# Patient Record
Sex: Male | Born: 1980
Health system: Southern US, Community
[De-identification: ages and names within clinical notes are randomized; demographics above are authoritative.]

## PROBLEM LIST (undated history)

## (undated) ENCOUNTER — Emergency Department (HOSPITAL_COMMUNITY): Admission: EM | Payer: BLUE CROSS/BLUE SHIELD | Source: Home / Self Care

## (undated) DIAGNOSIS — I10 Essential (primary) hypertension: Secondary | ICD-10-CM

## (undated) DIAGNOSIS — E119 Type 2 diabetes mellitus without complications: Secondary | ICD-10-CM

## (undated) DIAGNOSIS — I82409 Acute embolism and thrombosis of unspecified deep veins of unspecified lower extremity: Secondary | ICD-10-CM

## (undated) DIAGNOSIS — K760 Fatty (change of) liver, not elsewhere classified: Secondary | ICD-10-CM

## (undated) DIAGNOSIS — K429 Umbilical hernia without obstruction or gangrene: Secondary | ICD-10-CM

## (undated) DIAGNOSIS — M109 Gout, unspecified: Secondary | ICD-10-CM

## (undated) HISTORY — DX: Essential (primary) hypertension: I10

## (undated) HISTORY — DX: Umbilical hernia without obstruction or gangrene: K42.9

## (undated) HISTORY — DX: Fatty (change of) liver, not elsewhere classified: K76.0

## (undated) HISTORY — DX: Type 2 diabetes mellitus without complications: E11.9

---

## 2006-06-01 ENCOUNTER — Encounter: Admission: RE | Admit: 2006-06-01 | Discharge: 2006-06-01 | Payer: Self-pay | Admitting: Orthopedic Surgery

## 2018-06-03 DIAGNOSIS — Z6839 Body mass index (BMI) 39.0-39.9, adult: Secondary | ICD-10-CM | POA: Diagnosis not present

## 2018-06-03 DIAGNOSIS — M109 Gout, unspecified: Secondary | ICD-10-CM | POA: Diagnosis not present

## 2018-06-22 DIAGNOSIS — Z86718 Personal history of other venous thrombosis and embolism: Secondary | ICD-10-CM | POA: Diagnosis not present

## 2018-06-22 DIAGNOSIS — M79604 Pain in right leg: Secondary | ICD-10-CM | POA: Diagnosis not present

## 2018-06-23 ENCOUNTER — Encounter (HOSPITAL_COMMUNITY): Payer: Self-pay | Admitting: Emergency Medicine

## 2018-06-23 ENCOUNTER — Ambulatory Visit (HOSPITAL_BASED_OUTPATIENT_CLINIC_OR_DEPARTMENT_OTHER)
Admission: RE | Admit: 2018-06-23 | Discharge: 2018-06-23 | Disposition: A | Discharge status: Home / Self Care | Payer: BLUE CROSS/BLUE SHIELD | Source: Ambulatory Visit | Attending: Emergency Medicine | Admitting: Emergency Medicine

## 2018-06-23 ENCOUNTER — Other Ambulatory Visit: Payer: Self-pay

## 2018-06-23 ENCOUNTER — Emergency Department (HOSPITAL_COMMUNITY)
Admission: EM | Admit: 2018-06-23 | Discharge: 2018-06-23 | Disposition: A | Discharge status: Home / Self Care | Payer: BLUE CROSS/BLUE SHIELD | Attending: Emergency Medicine | Admitting: Emergency Medicine

## 2018-06-23 ENCOUNTER — Emergency Department (HOSPITAL_COMMUNITY): Payer: BLUE CROSS/BLUE SHIELD

## 2018-06-23 DIAGNOSIS — M79651 Pain in right thigh: Secondary | ICD-10-CM | POA: Diagnosis not present

## 2018-06-23 DIAGNOSIS — Z86718 Personal history of other venous thrombosis and embolism: Secondary | ICD-10-CM | POA: Insufficient documentation

## 2018-06-23 DIAGNOSIS — M79609 Pain in unspecified limb: Secondary | ICD-10-CM

## 2018-06-23 DIAGNOSIS — M79661 Pain in right lower leg: Secondary | ICD-10-CM | POA: Insufficient documentation

## 2018-06-23 DIAGNOSIS — R7989 Other specified abnormal findings of blood chemistry: Secondary | ICD-10-CM | POA: Diagnosis not present

## 2018-06-23 DIAGNOSIS — R000 Tachycardia, unspecified: Secondary | ICD-10-CM | POA: Insufficient documentation

## 2018-06-23 DIAGNOSIS — M79604 Pain in right leg: Secondary | ICD-10-CM | POA: Diagnosis not present

## 2018-06-23 DIAGNOSIS — M791 Myalgia, unspecified site: Secondary | ICD-10-CM | POA: Diagnosis not present

## 2018-06-23 HISTORY — DX: Acute embolism and thrombosis of unspecified deep veins of unspecified lower extremity: I82.409

## 2018-06-23 HISTORY — DX: Gout, unspecified: M10.9

## 2018-06-23 LAB — CBC WITH DIFFERENTIAL/PLATELET
Abs Immature Granulocytes: 0.06 10*3/uL (ref 0.00–0.07)
Basophils Absolute: 0.1 10*3/uL (ref 0.0–0.1)
Basophils Relative: 1 %
Eosinophils Absolute: 0.4 10*3/uL (ref 0.0–0.5)
Eosinophils Relative: 3 %
HCT: 47.8 % (ref 39.0–52.0)
Hemoglobin: 15.9 g/dL (ref 13.0–17.0)
Immature Granulocytes: 1 %
Lymphocytes Relative: 16 %
Lymphs Abs: 2.1 10*3/uL (ref 0.7–4.0)
MCH: 30.4 pg (ref 26.0–34.0)
MCHC: 33.3 g/dL (ref 30.0–36.0)
MCV: 91.4 fL (ref 80.0–100.0)
Monocytes Absolute: 1.2 10*3/uL — ABNORMAL HIGH (ref 0.1–1.0)
Monocytes Relative: 9 %
Neutro Abs: 9.2 10*3/uL — ABNORMAL HIGH (ref 1.7–7.7)
Neutrophils Relative %: 70 %
Platelets: 407 10*3/uL — ABNORMAL HIGH (ref 150–400)
RBC: 5.23 MIL/uL (ref 4.22–5.81)
RDW: 12.9 % (ref 11.5–15.5)
WBC: 13 10*3/uL — ABNORMAL HIGH (ref 4.0–10.5)
nRBC: 0 % (ref 0.0–0.2)

## 2018-06-23 LAB — BASIC METABOLIC PANEL
Anion gap: 12 (ref 5–15)
BUN: 14 mg/dL (ref 6–20)
CO2: 24 mmol/L (ref 22–32)
Calcium: 9.6 mg/dL (ref 8.9–10.3)
Chloride: 101 mmol/L (ref 98–111)
Creatinine, Ser: 1.27 mg/dL — ABNORMAL HIGH (ref 0.61–1.24)
GFR calc Af Amer: >60 mL/min (ref >60)
GFR calc non Af Amer: >60 mL/min (ref >60)
Glucose, Bld: 168 mg/dL — ABNORMAL HIGH (ref 70–99)
Potassium: 3.9 mmol/L (ref 3.5–5.1)
Sodium: 137 mmol/L (ref 135–145)

## 2018-06-23 MED ORDER — HYDROCODONE-ACETAMINOPHEN 5-325 MG PO TABS: 1.0000 | ORAL_TABLET | Freq: Four times a day (QID) | ORAL | 0 refills | Status: DC | PRN

## 2018-06-23 MED ORDER — IOHEXOL 350 MG/ML SOLN
75.0000 mL | Freq: Once | INTRAVENOUS | Status: AC | PRN
  Administered 2018-06-23: 75 mL via INTRAVENOUS

## 2018-06-23 NOTE — Progress Notes (Signed)
VASCULAR LAB PRELIMINARY  PRELIMINARY  PRELIMINARY  PRELIMINARY  Right lower extremity venous duplex completed.    Preliminary report:  See CV Proc for results.   Jahzeel Poythress, RVT 06/23/2018, 12:11 PM

## 2018-06-23 NOTE — ED Notes (Signed)
Gave pt cheese/crackers and water to drink

## 2018-06-23 NOTE — Discharge Instructions (Signed)
Please see the information and instructions below regarding your visit.  Your diagnoses today include:  1. Right leg pain   2. Sinus tachycardia   3. Elevated serum creatinine     Tests performed today include: See side panel of your discharge paperwork for testing performed today. Vital signs are listed at the bottom of these instructions.   The CT of your chest showed no evidence of blood clot in the lungs.  Your lab work is showing a slight elevation in your creatinine level.  This is 1 of the kidney numbers.  Please have this rechecked by your primary care provider.  Medications prescribed:    Take any prescribed medications only as prescribed, and any over the counter medications only as directed on the packaging.  You have been prescribed Norco for pain. This is an opioid pain medication. You may take this medication every 4-6 hours as needed for pain. Only take this medication if you need it for breakthrough pain.   Do not combine this medication with Tylenol, as it may increase the risk of liver problems.  Do not combine this medication with alcohol.  Please be advised to avoid driving or operating heavy machinery while taking this medication, as it may make you drowsy or impair judgment.    Home care instructions:  Please follow any educational materials contained in this packet.   Follow-up instructions: IMPORTANT PATIENT INSTRUCTIONS:  You have been scheduled for an Outpatient Vascular Study at [facility].    If tomorrow is a Saturday, Sunday or holiday, please go to the [facility] Emergency Department Registration Desk at 11 am tomorrow morning and tell them you are there for a vascular study.   If tomorrow is a weekday (Monday-Friday), please go to [facility] Registration at 11 am and tell them you are there for a vascular study.  Return instructions:  Please return to the Emergency Department if you  experience worsening symptoms.  Please return the emergency department if you develop any discoloration of the right lower extremity, redness, increase in swelling, pain, or fevers greater than 100.4. Please return if you have any other emergent concerns.  Additional Information:   Your vital signs today were: BP 133/79    Pulse (!) 112    Resp (!) 23    SpO2 97%  If your blood pressure (BP) was elevated on multiple readings during this visit above 130 for the top number or above 80 for the bottom number, please have this repeated by your primary care provider within one month. --------------  Thank you for allowing [place] to participate in your care today.

## 2018-06-23 NOTE — ED Provider Notes (Signed)
[facility] EMERGENCY DEPARTMENT Provider Note   CSN: [id number] Arrival date & time: 06/23/18  0154    History   Chief Complaint Chief Complaint  Patient presents with   Leg Pain    HPI Patrick Chandler is a 38 y.o. male.     HPI   Patient is a 38 year old male with past medical history of DVT and gout presenting for pain in the right popliteal region.  Patient describes the pain as throbbing.  He reports the pain is now radiating down to the right lower calf as well as the right thigh.  He reports it is been present over the past 3 days.  Patient reports that when initially began it felt like the DVT he had in his left lower extremity.  Patient denies any loss of sensation distally.  He denies any significant swelling of the right lower extremity.  Patient denies any erythema of the extremity, fever, chills.  He denies any shortness of breath or chest pain.  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  He was driving many hours a day when he last had a DVT in 2014.  He was anticoagulated with Xarelto for 6 months.  He visited [facility] rocking him earlier today who are unable to perform vascular ultrasound due to staffing, however they did start him on Xarelto of which she has had 1 dose.  Past Medical History:  Diagnosis Date   DVT (deep venous thrombosis) (HCC)    Gout     There are no active problems to display for this patient.   History reviewed. No pertinent surgical history.      Home Medications    Prior to Admission medications   Not on File    Family History No family history on file.  Social History Social History   Tobacco Use   Smoking status: Never Smoker   Smokeless tobacco: Never Used  Substance Use Topics   Alcohol use: Not Currently   Drug use: Never     Allergies   Patient has no known allergies.   Review of Systems Review of Systems  Constitutional: Negative for chills and fever.    Respiratory: Negative for shortness of breath.   Cardiovascular: Negative for chest pain and leg swelling.  Gastrointestinal: Negative for nausea and vomiting.  Musculoskeletal: Positive for myalgias. Negative for joint swelling.  Neurological: Negative for weakness and numbness.  All other systems reviewed and are negative.    Physical Exam Updated Vital Signs BP 131/77    Pulse (!) 109    Resp (!) 22    SpO2 95%   Physical Exam Vitals signs and nursing note reviewed.  Constitutional:      General: He is not in acute distress.    Appearance: He is well-developed.  HENT:     Head: Normocephalic and atraumatic.  Eyes:     Conjunctiva/sclera: Conjunctivae normal.     Pupils: Pupils are equal, round, and reactive to light.  Neck:     Musculoskeletal: Normal range of motion and neck supple.  Cardiovascular:     Rate and Rhythm: Normal rate and regular rhythm.     Heart sounds: S1 normal and S2 normal. No murmur.  Pulmonary:     Effort: Pulmonary effort is normal.     Breath sounds: Normal breath sounds. No wheezing or rales.     Comments: Converses comfortably. Speaking comfortably in full sentences.  Abdominal:     General: There  is no distension.     Palpations: Abdomen is soft.  Musculoskeletal: Normal range of motion.        General: No deformity.     Right lower leg: No edema.     Left lower leg: No edema.     Comments: Bilateral lower extremities are symmetric without evidence of edema.  He has intact, 2+ DP pulses bilaterally. Right lower extremity exam: No erythema or fluctuance. No pain out of proportion. No break in skin or injury. Patient has tenderness to palpation of the right calf, popliteal region, and mild TTP overlying posterior thigh. Patient able to flex and extend knee joint without difficulty.   Lymphadenopathy:     Cervical: No cervical adenopathy.  Skin:    General: Skin is warm and dry.     Findings: No erythema or rash.  Neurological:     Mental  Status: He is alert.     Comments: Cranial nerves grossly intact. Patient moves extremities symmetrically and with good coordination.  Psychiatric:        Behavior: Behavior normal.        Thought Content: Thought content normal.        Judgment: Judgment normal.      ED Treatments / Results  Labs (all labs ordered are listed, but only abnormal results are displayed) Labs Reviewed  CBC WITH DIFFERENTIAL/PLATELET - Abnormal; Notable for the following components:      Result Value   WBC 13.0 (*)    Platelets 407 (*)    Neutro Abs 9.2 (*)    Monocytes Absolute 1.2 (*)    All other components within normal limits  BASIC METABOLIC PANEL - Abnormal; Notable for the following components:   Glucose, Bld 168 (*)    Creatinine, Ser 1.27 (*)    All other components within normal limits    EKG EKG Interpretation  Date/Time:  Sunday June 23 2018 02:31:01 EDT Ventricular Rate:  121 PR Interval:    QRS Duration: 109 QT Interval:  336 QTC Calculation: 477 R Axis:   -121 Text Interpretation:  Sinus tachycardia Consider right ventricular hypertrophy Borderline prolonged QT interval No old tracing to compare Confirmed by Marily Memos [phone]) on 06/23/2018 4:20:37 AM   Radiology Ct Angio Chest Pe W/cm &/or Wo Cm  Result Date: 06/23/2018 CLINICAL DATA:  Right leg pain and history of DVT. EXAM: CT ANGIOGRAPHY CHEST WITH CONTRAST TECHNIQUE: Multidetector CT imaging of the chest was performed using the standard protocol during bolus administration of intravenous contrast. Multiplanar CT image reconstructions and MIPs were obtained to evaluate the vascular anatomy. CONTRAST:  75mL OMNIPAQUE IOHEXOL 350 MG/ML SOLN COMPARISON:  None. FINDINGS: Cardiovascular: --Pulmonary arteries: Contrast injection is sufficient to demonstrate satisfactory opacification of the pulmonary arteries to the segmental level, with attenuation of 206 HU at the main pulmonary artery. There is no pulmonary embolus. The main  pulmonary artery is within normal limits for size. --Aorta: Satisfactory opacification of the thoracic aorta. No aortic dissection or other acute aortic syndrome. Conventional 3 vessel aortic branching pattern. The aortic course and caliber are normal. There is no aortic atherosclerosis. --Heart: Normal size. No pericardial effusion. Mediastinum/Nodes: No mediastinal, hilar or axillary lymphadenopathy. The visualized thyroid and thoracic esophageal course are unremarkable. Lungs/Pleura: No pulmonary nodules or masses. No pleural effusion or pneumothorax. No focal airspace consolidation. No focal pleural abnormality. Upper Abdomen: Contrast bolus timing is not optimized for evaluation of the abdominal organs. Within this limitation, the visualized organs of the upper abdomen  are normal. Musculoskeletal: No chest wall abnormality. No acute or significant osseous findings. Review of the MIP images confirms the above findings. IMPRESSION: No pulmonary embolus or other acute abnormality. Electronically Signed   By: Deatra Robinson M.D.   On: 06/23/2018 03:48    Procedures Procedures (including critical care time)  EMERGENCY DEPARTMENT US SOFT TISSUE INTERPRETATION "Study: Limited Soft Tissue Ultrasound"  INDICATIONS: Pain Multiple views of the body part were obtained in real-time with a multi-frequency linear probe PERFORMED BY:  Myself IMAGES ARCHIVED?: No SIDE:Right  BODY PART:Lower extremity FINDINGS: No abcess noted and Cellulitis absent INTERPRETATION:  No abcess noted and No cellulitis noted   CPT: Neck 76536-26  Upper extremity 76880-26  Axilla 38329-19  Chest wall 16606-00  Beast 45997-74  Upper back 14239-53  Lower back 20233-43  Abdominal wall 56861-68  Pelvic wall 37290-21  Lower extremity 11552-08  Other soft tissue 02233-61   Medications Ordered in ED Medications  iohexol (OMNIPAQUE) 350 MG/ML injection 75 mL (75 mLs Intravenous Contrast Given 06/23/18 0332)      Initial Impression / Assessment and Plan / ED Course  I have reviewed the triage vital signs and the nursing notes.  Pertinent labs & imaging results that were available during my care of the patient were reviewed by me and considered in my medical decision making (see chart for details).  Clinical Course as of Jun 23 723  Sun Jun 23, 2018  0327 Obtaining CTA given clinical symptoms of DVT and tachycardia.   Pulse Rate(!): 114 [AM]  0724 Respirations derived from validation of readings picked up by cardiac monitor. RR 22 on my final evaluation of patient. Patient with no increased work of breathing.   Resp(!): 28 [AM]    Clinical Course User Index [AM] Elisha Ponder, PA-C       Patient is nontoxic-appearing, afebrile, and in no acute distress at rest.  He is slightly tachycardic and tachypneic.  Denies any chest pain or shortness of breath, however given the clinical diagnosis concerning for DVT, will assess for pulmonary embolism with CTPA.  Right lower extremity without evidence of infection clinically.  Right lower extremity ultrasound performed at bedside assessing for cellulitis or abscess which demonstrates no evidence of either pathology.  Patient is able to flex and extend the right knee without difficulty and there is no erythema or circumferential swelling suggestive of septic arthritis.  No pain out of proportion to palpation of the lower extremity.   CTPA demonstrates no evidence of pulmonary embolus.  Patient did have a slight leukocytosis of 13.  This appears nonspecific and he has no other infectious symptoms associated with this leukocytosis.  Patient did continue to have some tachycardic readings here in the emergency department.  Manually checked pulse and it is 108. He does not have any infectious etiology identified on his work-up.  This does not appear to be indicative of a sepsis syndrome without evidence of infection, and he is negative for PE.  Work-up, vital  signs, and clinical presentation reviewed with attending physician, Dr. Marily Memos, who is in agreement with plan of care. Patient is already taking Xarelto starting today so he is being anticoagulated.   I have reviewed the patient's information in the [place] Controlled Substance Database for the past 12 months and found them to have no Rx overlapping.  Opiates were prescribed for an acute, painful condition. The patient was given information on side effects and encouraged to use other, non-opiate pain medication primary, only  using opiate medicine sparingly for severe pain.  Final Clinical Impressions(s) / ED Diagnoses   Final diagnoses:  Right leg pain    ED Discharge Orders         Ordered    LE VENOUS     06/23/18 0458    HYDROcodone-acetaminophen (NORCO/VICODIN) 5-325 MG tablet  Every 6 hours PRN     06/23/18 0502           Elisha PonderMurray, Rodney Yera B, PA-C 06/23/18 0727    Mesner, Barbara CowerJason, MD 06/24/18 [phone]

## 2018-06-23 NOTE — ED Triage Notes (Signed)
Pt c/o pain behind his right knee that radiates to the thigh and calf. Pt seen at [facility] yesterday for same, started on xarelto. Per pt, no imaging or lab work done. Pt has hx of DVT. Denies chest pain/shortness of breath. Reports pain increased tonight.

## 2018-06-25 DIAGNOSIS — Z6838 Body mass index (BMI) 38.0-38.9, adult: Secondary | ICD-10-CM | POA: Diagnosis not present

## 2018-06-25 DIAGNOSIS — Z86718 Personal history of other venous thrombosis and embolism: Secondary | ICD-10-CM | POA: Diagnosis not present

## 2018-06-25 DIAGNOSIS — M25461 Effusion, right knee: Secondary | ICD-10-CM | POA: Diagnosis not present

## 2018-06-25 DIAGNOSIS — M109 Gout, unspecified: Secondary | ICD-10-CM | POA: Diagnosis not present

## 2018-06-27 DIAGNOSIS — Q383 Other congenital malformations of tongue: Secondary | ICD-10-CM | POA: Diagnosis not present

## 2018-10-20 DIAGNOSIS — L03114 Cellulitis of left upper limb: Secondary | ICD-10-CM | POA: Diagnosis not present

## 2019-04-09 DIAGNOSIS — R1013 Epigastric pain: Secondary | ICD-10-CM | POA: Diagnosis not present

## 2019-04-09 DIAGNOSIS — M10072 Idiopathic gout, left ankle and foot: Secondary | ICD-10-CM | POA: Diagnosis not present

## 2019-04-09 DIAGNOSIS — K219 Gastro-esophageal reflux disease without esophagitis: Secondary | ICD-10-CM | POA: Diagnosis not present

## 2019-04-09 DIAGNOSIS — K141 Geographic tongue: Secondary | ICD-10-CM | POA: Diagnosis not present

## 2019-04-10 DIAGNOSIS — R1013 Epigastric pain: Secondary | ICD-10-CM | POA: Diagnosis not present

## 2019-04-14 DIAGNOSIS — K760 Fatty (change of) liver, not elsewhere classified: Secondary | ICD-10-CM | POA: Diagnosis not present

## 2019-04-14 DIAGNOSIS — R599 Enlarged lymph nodes, unspecified: Secondary | ICD-10-CM | POA: Diagnosis not present

## 2019-04-14 DIAGNOSIS — K859 Acute pancreatitis without necrosis or infection, unspecified: Secondary | ICD-10-CM | POA: Diagnosis not present

## 2019-04-14 DIAGNOSIS — N200 Calculus of kidney: Secondary | ICD-10-CM | POA: Diagnosis not present

## 2019-04-23 DIAGNOSIS — R5383 Other fatigue: Secondary | ICD-10-CM | POA: Diagnosis not present

## 2019-04-23 DIAGNOSIS — R748 Abnormal levels of other serum enzymes: Secondary | ICD-10-CM | POA: Diagnosis not present

## 2019-04-23 DIAGNOSIS — K219 Gastro-esophageal reflux disease without esophagitis: Secondary | ICD-10-CM | POA: Diagnosis not present

## 2019-05-05 DIAGNOSIS — K143 Hypertrophy of tongue papillae: Secondary | ICD-10-CM | POA: Diagnosis not present

## 2019-05-05 DIAGNOSIS — R748 Abnormal levels of other serum enzymes: Secondary | ICD-10-CM | POA: Diagnosis not present

## 2019-05-05 DIAGNOSIS — R1115 Cyclical vomiting syndrome unrelated to migraine: Secondary | ICD-10-CM | POA: Diagnosis not present

## 2019-05-05 DIAGNOSIS — R591 Generalized enlarged lymph nodes: Secondary | ICD-10-CM | POA: Diagnosis not present

## 2019-06-23 DIAGNOSIS — Z6841 Body Mass Index (BMI) 40.0 and over, adult: Secondary | ICD-10-CM | POA: Diagnosis not present

## 2019-06-23 DIAGNOSIS — K219 Gastro-esophageal reflux disease without esophagitis: Secondary | ICD-10-CM | POA: Diagnosis not present

## 2019-06-23 DIAGNOSIS — I10 Essential (primary) hypertension: Secondary | ICD-10-CM | POA: Diagnosis not present

## 2019-07-01 DIAGNOSIS — R5383 Other fatigue: Secondary | ICD-10-CM | POA: Diagnosis not present

## 2019-07-01 DIAGNOSIS — K219 Gastro-esophageal reflux disease without esophagitis: Secondary | ICD-10-CM | POA: Diagnosis not present

## 2019-07-01 DIAGNOSIS — I10 Essential (primary) hypertension: Secondary | ICD-10-CM | POA: Diagnosis not present

## 2019-07-07 DIAGNOSIS — R1115 Cyclical vomiting syndrome unrelated to migraine: Secondary | ICD-10-CM | POA: Diagnosis not present

## 2019-07-07 DIAGNOSIS — R748 Abnormal levels of other serum enzymes: Secondary | ICD-10-CM | POA: Diagnosis not present

## 2019-07-07 DIAGNOSIS — K143 Hypertrophy of tongue papillae: Secondary | ICD-10-CM | POA: Diagnosis not present

## 2019-07-07 DIAGNOSIS — R221 Localized swelling, mass and lump, neck: Secondary | ICD-10-CM | POA: Diagnosis not present

## 2019-07-07 DIAGNOSIS — R591 Generalized enlarged lymph nodes: Secondary | ICD-10-CM | POA: Diagnosis not present

## 2019-07-10 DIAGNOSIS — R748 Abnormal levels of other serum enzymes: Secondary | ICD-10-CM | POA: Diagnosis not present

## 2019-07-10 DIAGNOSIS — Z1389 Encounter for screening for other disorder: Secondary | ICD-10-CM | POA: Diagnosis not present

## 2019-07-10 DIAGNOSIS — I10 Essential (primary) hypertension: Secondary | ICD-10-CM | POA: Diagnosis not present

## 2019-07-10 DIAGNOSIS — K143 Hypertrophy of tongue papillae: Secondary | ICD-10-CM | POA: Diagnosis not present

## 2019-07-10 DIAGNOSIS — M109 Gout, unspecified: Secondary | ICD-10-CM | POA: Diagnosis not present

## 2019-07-10 DIAGNOSIS — Z1331 Encounter for screening for depression: Secondary | ICD-10-CM | POA: Diagnosis not present

## 2019-07-16 DIAGNOSIS — R748 Abnormal levels of other serum enzymes: Secondary | ICD-10-CM | POA: Diagnosis not present

## 2019-07-16 DIAGNOSIS — R1115 Cyclical vomiting syndrome unrelated to migraine: Secondary | ICD-10-CM | POA: Diagnosis not present

## 2019-07-16 DIAGNOSIS — Z6841 Body Mass Index (BMI) 40.0 and over, adult: Secondary | ICD-10-CM | POA: Diagnosis not present

## 2019-07-16 DIAGNOSIS — K760 Fatty (change of) liver, not elsewhere classified: Secondary | ICD-10-CM | POA: Diagnosis not present

## 2019-08-28 ENCOUNTER — Ambulatory Visit (INDEPENDENT_AMBULATORY_CARE_PROVIDER_SITE_OTHER): Payer: BLUE CROSS/BLUE SHIELD | Admitting: Gastroenterology

## 2021-02-11 IMAGING — CT CT ANGIOGRAPHY CHEST
2 of 7 series · 18 of 46 positions shown · IV contrast (APPLIED)
Comparison: None.

CLINICAL DATA: Right leg pain and history of DVT.

EXAM:
CT ANGIOGRAPHY CHEST WITH CONTRAST
TECHNIQUE: Multidetector CT imaging of the chest was performed using the
standard protocol during bolus administration of intravenous
contrast. Multiplanar CT image reconstructions and MIPs were
obtained to evaluate the vascular anatomy.
CONTRAST:  75mL OMNIPAQUE IOHEXOL 350 MG/ML SOLN

[Series 6: thins · axial · 0.90mm/px · z∈[+1137,+1396]mm · 15 of 418 slices shown]
[im 24/418  lung]
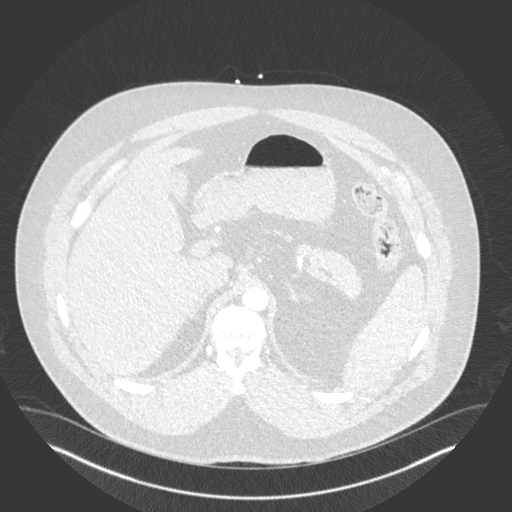
[im 47/418  soft-tissue]
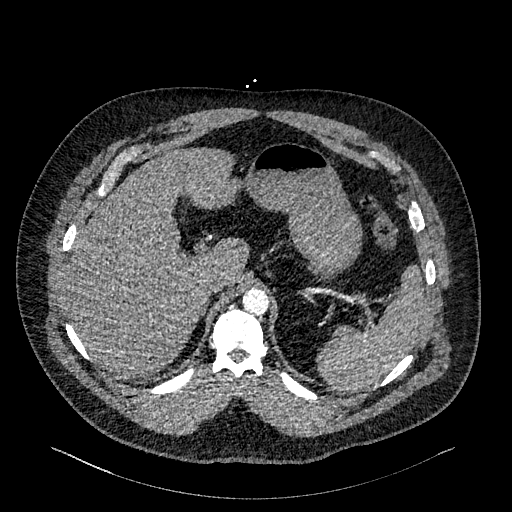
[im 70/418  lung]
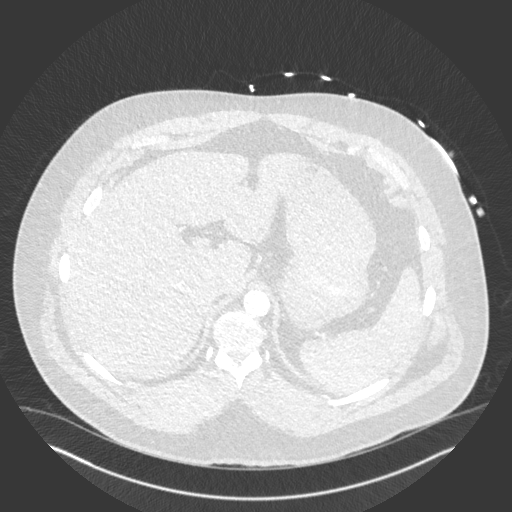
[im 93/418  soft-tissue]
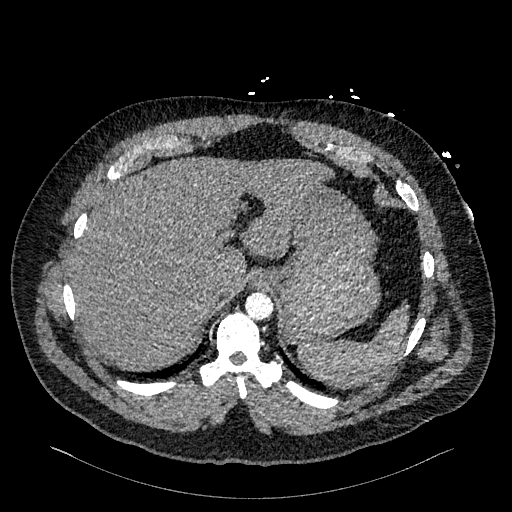
[im 140/418  lung]
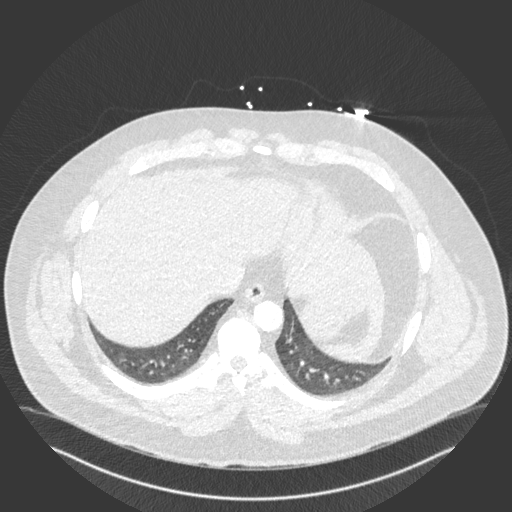
[im 163/418  soft-tissue]
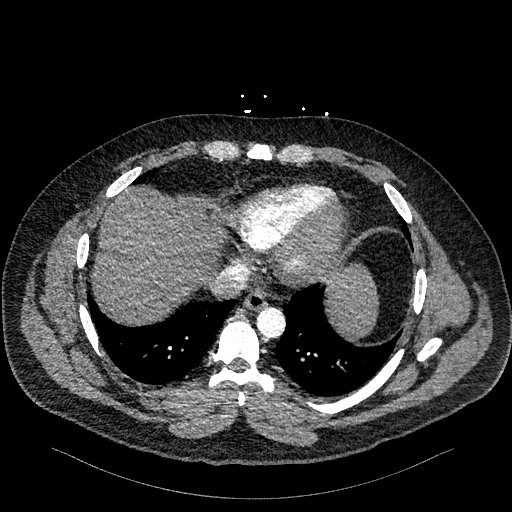
[im 186/418  lung]
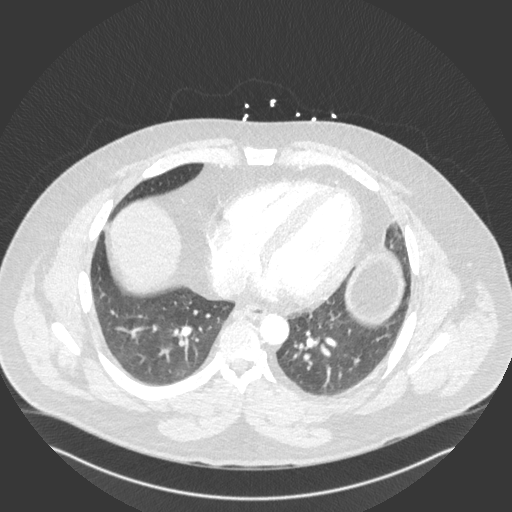
[im 209/418  soft-tissue]
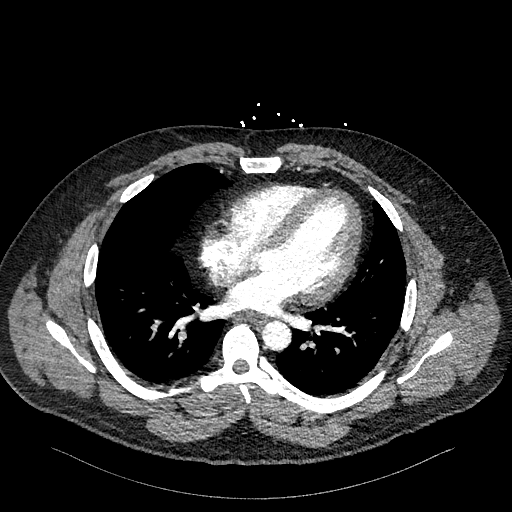
[im 232/418  lung]
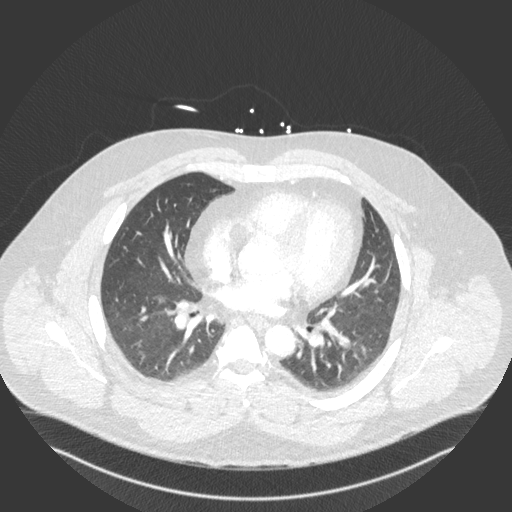
[im 255/418  soft-tissue]
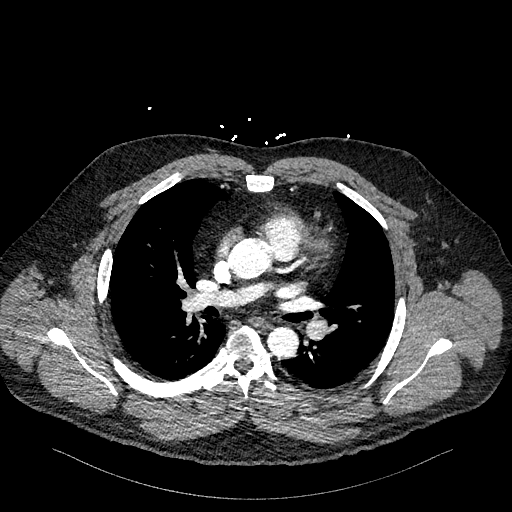
[im 279/418  lung]
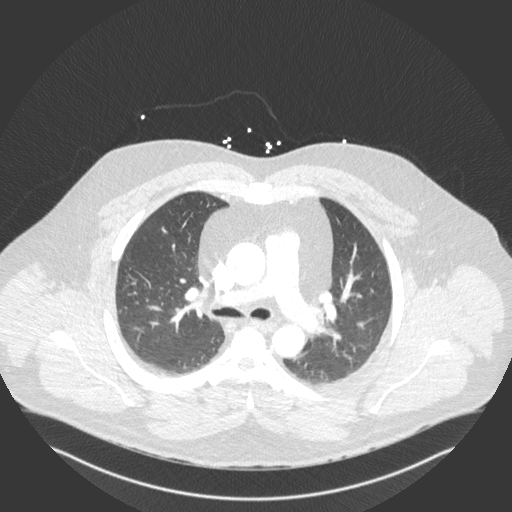
[im 325/418  soft-tissue]
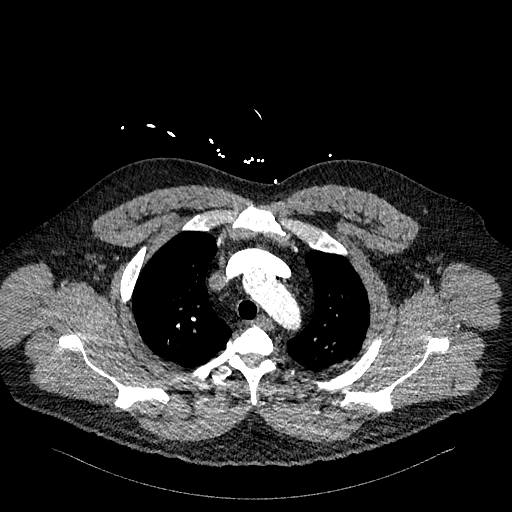
[im 348/418  lung]
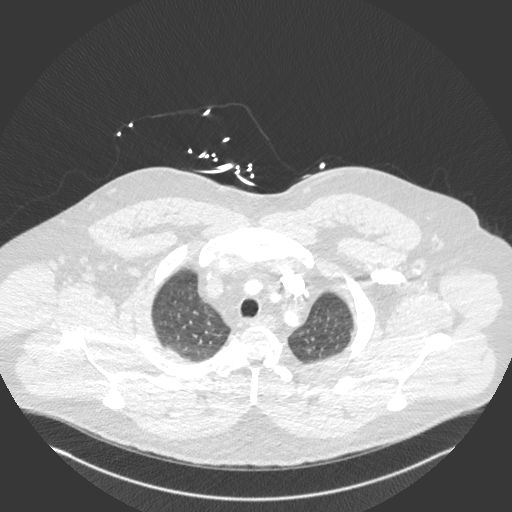
[im 371/418  soft-tissue]
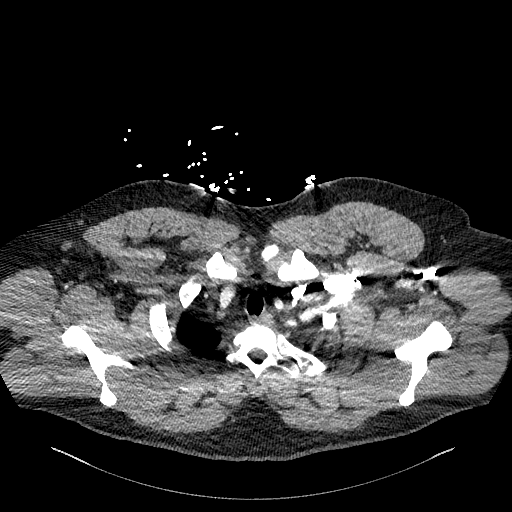
[im 394/418  lung]
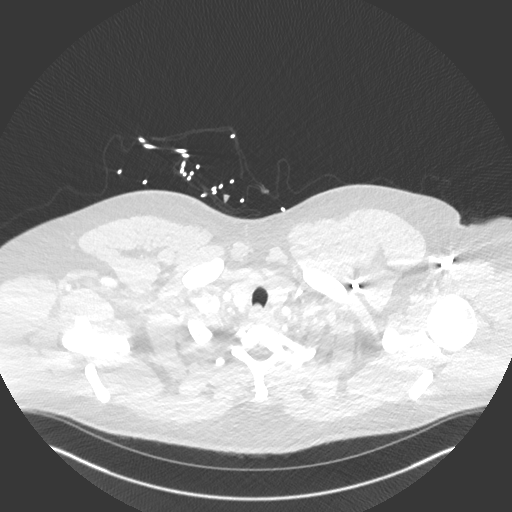

[Series 8: cor · coronal · 0.59mm/px · 3 of 151 slices shown]
[im 38/151  soft-tissue]
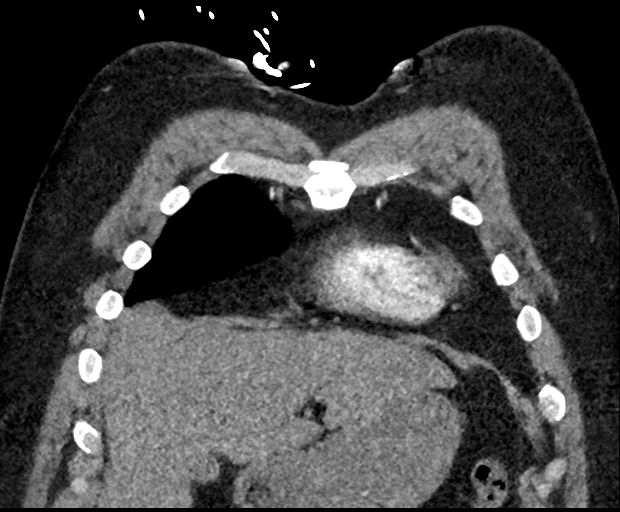
[im 76/151  soft-tissue]
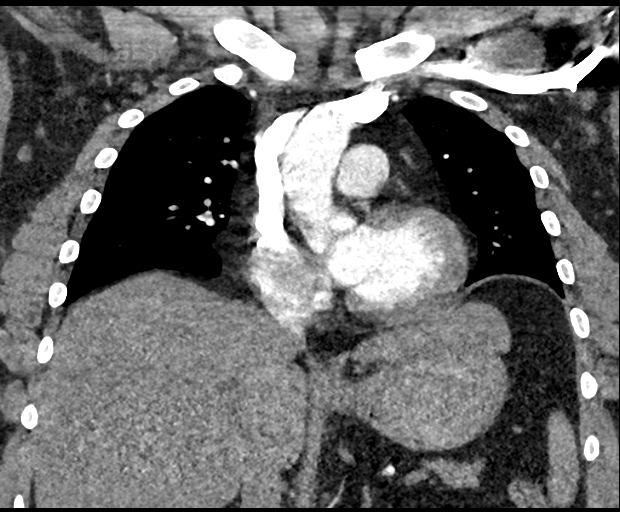
[im 113/151  soft-tissue]
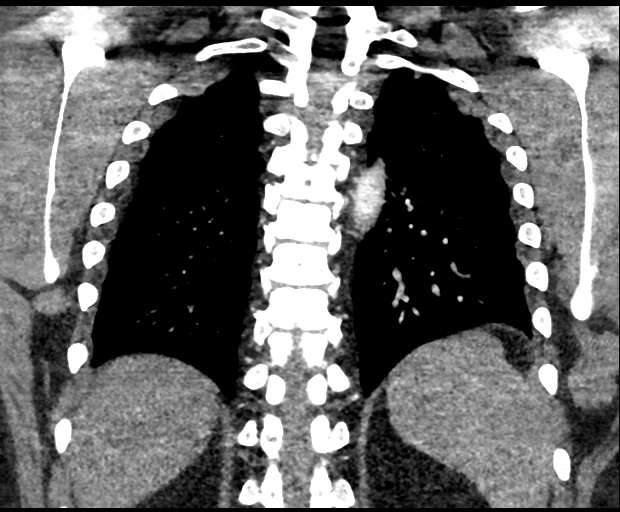

[18 of 46 positions shown; findings below may reference images not displayed]

FINDINGS: Cardiovascular:

--Pulmonary arteries: Contrast injection is sufficient to
demonstrate satisfactory opacification of the pulmonary arteries to
the segmental level, with attenuation of 206 HU at the main
pulmonary artery. There is no pulmonary embolus. The main pulmonary
artery is within normal limits for size.

--Aorta: Satisfactory opacification of the thoracic aorta. No aortic
dissection or other acute aortic syndrome. Conventional 3 vessel
aortic branching pattern. The aortic course and caliber are normal.
There is no aortic atherosclerosis.

--Heart: Normal size. No pericardial effusion.

Mediastinum/Nodes: No mediastinal, hilar or axillary
lymphadenopathy. The visualized thyroid and thoracic esophageal
course are unremarkable.

Lungs/Pleura: No pulmonary nodules or masses. No pleural effusion or
pneumothorax. No focal airspace consolidation. No focal pleural
abnormality.

Upper Abdomen: Contrast bolus timing is not optimized for evaluation
of the abdominal organs. Within this limitation, the visualized
organs of the upper abdomen are normal.

Musculoskeletal: No chest wall abnormality. No acute or significant
osseous findings.

Review of the MIP images confirms the above findings.
IMPRESSION: No pulmonary embolus or other acute abnormality.

## 2021-07-21 ENCOUNTER — Encounter: Payer: Self-pay | Admitting: Internal Medicine

## 2021-08-09 NOTE — Progress Notes (Deleted)
GI Office Note    Referring Provider: Curlene Labrum, MD Primary Care Physician:  Curlene Labrum, MD  Primary Gastroenterologist: Dr. Abbey Chatters  Chief Complaint   No chief complaint on file.    History of Present Illness   Patrick Chandler is a 41 y.o. male presenting today at the request of Burdine, Virgina Evener, MD for ***elevated amylase and lipase.  Most recent labs via [facility] from June 2021 with elevated lipase (4636) and amylase (914), AST 195, ALT 164, alk phos 134, normal CBC.  Has previously seen [facility] hematology oncology for isolated elevated amylase in March 2021.  Stated that this is usually due to salivary disease, macromylasemia, idiopathic hypermylasemia.  Was also having recurrent emesis, was referred for upper endoscopy.  Had hepatic steatosis noted on abdominal CT performed February 2021, likely felt as it was due to morbid obesity.  He had been experiencing severe epigastric pain for 3 to 4 months associated with emesis, not feeling better after he vomits.  Also noted tenderness to left submandibular salivary gland.  Denied tobacco or alcohol abuse.  Per care everywhere note he has CT abdomen in February 2021 showing 3 mm peripheral left lower lobe lung nodule, liver decrease in attenuation diffusely measuring 19.6 cm consistent with steatosis, numerous periportal, abdominal peritoneal ligament retroperitoneal and mesenteric lymph nodes most of which are subcentimeter in size  Follow-up with heme-onc 07/16/2019.  Patient had not had EGD, had a few episodes of emesis since last visit.  Reported never had an abdominal ultrasound.  Had gained 1 pound since last visit.  Today: Pancreatitis Patient complains of {complaints; pancreatitis:11701}. Symptoms have been present {0-10:33138} {unit:11}. Symptoms include {pancreatitis sx:11697}. Symptoms have been {course:17}.  The pain is located in {pain list:11699}, is described as {quality:10965}, {pain assessment:11180}, and  is rated {gi pain:11702}. The pain radiates to {pain list:11699}. The patient states that the pain is relieved by {pain relief:11563}. Aggravating factors include {aggravating factors:11700}. The patient has tried {pain relief :11349} with {relief:12621} relief.  Alcohol history:{alcohol:11675}. Prior diagnostic studies: {gi prior dx studies:11703}    Past Medical History:  Diagnosis Date   DVT (deep venous thrombosis) ([place])    Gout     No past surgical history on file.  Current Outpatient Medications  Medication Sig Dispense Refill   HYDROcodone-acetaminophen (NORCO/VICODIN) 5-325 MG tablet Take 1-2 tablets by mouth every 6 (six) hours as needed. 8 tablet 0   No current facility-administered medications for this visit.    Allergies as of 08/11/2021   (No Known Allergies)    No family history on file.  Social History   Socioeconomic History   Marital status: Married    Spouse name: Not on file   Number of children: Not on file   Years of education: Not on file   Highest education level: Not on file  Occupational History   Not on file  Tobacco Use   Smoking status: Never   Smokeless tobacco: Never  Substance and Sexual Activity   Alcohol use: Not Currently   Drug use: Never   Sexual activity: Not on file  Other Topics Concern   Not on file  Social History Narrative   Not on file   Social Determinants of Health   Financial Resource Strain: Not on file  Food Insecurity: Not on file  Transportation Needs: Not on file  Physical Activity: Not on file  Stress: Not on file  Social Connections: Not on file  Intimate Partner Violence: Not  on file     Review of Systems   Gen: Denies any fever, chills, fatigue, weight loss, lack of appetite.  CV: Denies chest pain, heart palpitations, peripheral edema, syncope.  Resp: Denies shortness of breath at rest or with exertion. Denies wheezing or cough.  GI: see HPI GU : Denies urinary burning, urinary frequency, urinary  hesitancy MS: Denies joint pain, muscle weakness, cramps, or limitation of movement.  Derm: Denies rash, itching, dry skin Psych: Denies depression, anxiety, memory loss, and confusion Heme: Denies bruising, bleeding, and enlarged lymph nodes.   Physical Exam   There were no vitals taken for this visit.  General:   Alert and oriented. Pleasant and cooperative. Well-nourished and well-developed.  Head:  Normocephalic and atraumatic. Eyes:  Without icterus, sclera clear and conjunctiva pink.  Ears:  Normal auditory acuity. Mouth:  No deformity or lesions, oral mucosa pink.  Lungs:  Clear to auscultation bilaterally. No wheezes, rales, or rhonchi. No distress.  Heart:  S1, S2 present without murmurs appreciated.  Abdomen:  +BS, soft, non-tender and non-distended. No HSM noted. No guarding or rebound. No masses appreciated.  Rectal:  Deferred  Msk:  Symmetrical without gross deformities. Normal posture. Extremities:  Without edema. Neurologic:  Alert and  oriented x4;  grossly normal neurologically. Skin:  Intact without significant lesions or rashes. Psych:  Alert and cooperative. Normal mood and affect.   Assessment   Patrick Chandler is a 41 y.o. male with a history of gout, GERD, DVT and pancreatitis*** presenting today for evaluation of elevated amylase.  Elevated Amylase and Lipase/History of pancreatitis:   PLAN   ***    Venetia Night, MSN, FNP-BC, AGACNP-BC [facility]

## 2021-08-10 ENCOUNTER — Ambulatory Visit: Payer: PRIVATE HEALTH INSURANCE | Admitting: Internal Medicine

## 2021-08-11 ENCOUNTER — Ambulatory Visit: Payer: PRIVATE HEALTH INSURANCE | Admitting: Gastroenterology

## 2021-09-11 NOTE — Progress Notes (Deleted)
GI Office Note    Referring Provider: Curlene Labrum, MD Primary Care Physician:  Curlene Labrum, MD  Primary Gastroenterologist: Dr. Abbey Chatters  Chief Complaint   No chief complaint on file.   History of Present Illness   Patrick Chandler is a 41 y.o. male presenting today at the request of Burdine, Virgina Evener, MD for ***elevated amylase and lipase.    Has previously seen [facility] hematology oncology for isolated elevated amylase in March 2021. Patient also reported chronic intermittent nausea/vomiting and intermittent epigastric pain that would last for about 4 hours. Pain owuld not improve with vomiting. Reported tenderness to left submandibular salivary glands. Dr. Federico Flake stated that this is usually due to salivary disease, macromylasemia, idiopathic hypermylasemia.  Was also having recurrent emesis with brushing his tongue and teeth, was referred for upper endoscopy.  Diagnosed with black hairy tongue. Had hepatic steatosis noted on abdominal CT performed February 2021, likely felt as it was due to morbid obesity. Denied tobacco or alcohol abuse. He had prior labs form March 2021 with IgG 1481, IgA 527, and IgM 48 (etiology felt to be hepatic steatosis).  Patient also reported chronic intermittent nausea/vomiting.    Per care everywhere note he had CT abdomen in February 2021 showing 3 mm peripheral left lower lobe lung nodule, liver decrease in attenuation diffusely measuring 19.6 cm consistent with steatosis, numerous periportal, abdominal peritoneal ligament retroperitoneal and mesenteric lymph nodes most of which are subcentimeter in size   Follow-up with heme-onc 07/16/2019.  Patient had not had EGD, had a few episodes of emesis since last visit.  Reported never had an abdominal ultrasound.  Had gained 1 pound since last visit.  Had hospitalization at [facility] in June 2021 with elevated lipase (4636) and amylase (914), AST 195, ALT 164, alk phos 134, normal CBC. CT A/P with mild acute  pancreatitis and suspected cholelithiasis, tiny non-obstructing right renal calculus and small umbilical hernia containing fat. He was discharged with instructions to remain hydrated and clear liquid diet for 24-48 hours and then advance as tolerated. And to avoid tylenol or alcohol. In September 2021 patient had not followed up with [facility] hepatology but did report a 25lb weight loss due to diet and exercise.   Most recent labs sent with referral paperwork 07/08/21: glucose 136, Cr 1.31, normal LFTs, bili and alk phos, elevated triglycerides 151, HgbA1c 6, Amylsae 718?Marland Kitchen   PCP visit 07/13/21. 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 Weight was 322lbs, height 6' 2.5". Was given GI referral to discuss fatty liver, elevated amylase, and EGD, has been on PPI daily.    Today: GERD:  Epigastric pain? N/V? Yellowing of skin?    Past Medical History:  Diagnosis Date   DVT (deep venous thrombosis) (HCC)    Gout     No past surgical history on file.  Current Outpatient Medications  Medication Sig Dispense Refill   HYDROcodone-acetaminophen (NORCO/VICODIN) 5-325 MG tablet Take 1-2 tablets by mouth every 6 (six) hours as needed. 8 tablet 0   No current facility-administered medications for this visit.    Allergies as of 09/12/2021   (No Known Allergies)    No family history on file.  Social History   Socioeconomic History   Marital status: Married    Spouse name: Not on file   Number of children: Not on file   Years of education: Not on file   Highest education level: Not on file  Occupational History  Not on file  Tobacco Use   Smoking status: Never   Smokeless tobacco: Never  Substance and Sexual Activity   Alcohol use: Not Currently   Drug use: Never   Sexual activity: Not on file  Other Topics Concern   Not on file  Social History Narrative   Not on file   Social Determinants of Health   Financial Resource Strain: Not on file   Food Insecurity: Not on file  Transportation Needs: Not on file  Physical Activity: Not on file  Stress: Not on file  Social Connections: Not on file  Intimate Partner Violence: Not on file     Review of Systems   Gen: Denies any fever, chills, fatigue, weight loss, lack of appetite.  CV: Denies chest pain, heart palpitations, peripheral edema, syncope.  Resp: Denies shortness of breath at rest or with exertion. Denies wheezing or cough.  GI: see HPI GU : Denies urinary burning, urinary frequency, urinary hesitancy MS: Denies joint pain, muscle weakness, cramps, or limitation of movement.  Derm: Denies rash, itching, dry skin Psych: Denies depression, anxiety, memory loss, and confusion Heme: Denies bruising, bleeding, and enlarged lymph nodes.   Physical Exam   There were no vitals taken for this visit.   General:   Alert and oriented. Pleasant and cooperative. Well-nourished and well-developed.  Head:  Normocephalic and atraumatic. Eyes:  Without icterus, sclera clear and conjunctiva pink.  Ears:  Normal auditory acuity. Mouth:  No deformity or lesions, oral mucosa pink.  Lungs:  Clear to auscultation bilaterally. No wheezes, rales, or rhonchi. No distress.  Heart:  S1, S2 present without murmurs appreciated.  Abdomen:  +BS, soft, non-tender and non-distended. No HSM noted. No guarding or rebound. No masses appreciated.  Rectal:  Deferred  Msk:  Symmetrical without gross deformities. Normal posture. Extremities:  Without edema. Neurologic:  Alert and  oriented x4;  grossly normal neurologically. Skin:  Intact without significant lesions or rashes. Psych:  Alert and cooperative. Normal mood and affect.   Assessment   Patrick Chandler is a 41 y.o. male with a history of DVT in 2014, chronic elevation of amylase, pancreatitis, HTN, HLD, GERD*** presenting today for evaluation of elevated amylase.   Elevated Amylase/History of pancreatitis: Followed previously by heme onc.  See prior workup as stated in HPI. Leukemia/lymphoma workup negative Has black hairy tongue. This has been chronically elevated. Had episode of pancreatitis in June 2021. Most recent labs May 2023 with amylase elevated to 718, has previously been as high as 914. When amylase was at peak pt also had elevated Lipase to 4636 in June 2021. Pancreatitis noted on abdominal CT June 2021.   Hepatic steatosis: Noted on imaging from February 2021. Had prior elevations in LFTs at that time as well with noted cholelithiasis. EBV, CMV, ANA,  Hepatitis panel, HIV, Ttg IgA all negative in March 2021. IgG 1481, IgA 527, and IgM 48 in March 2021, IgA elevation felt to be elevated due to fatty liver and obesity. Most recent labs completed May 2023 with normal LFTs, bilirubin, and alk phos. No recent dedicated abdominal imaging. Would like to update abdominal ultrasound.   GERD: Remote history of chronic intermittent epigastric pain as well as nausea/vomiting.    PLAN   *** Proceed with upper endoscopy with propofol by Dr. Abbey Chatters in near future: the risks, benefits, and alternatives have been discussed with the patient in detail. The patient states understanding and desires to proceed. ASA 3 Abdominal ultrasound.  Continue PPI daily.  Follow up with hematology/oncology   Venetia Night, MSN, FNP-BC, AGACNP-BC [facility]

## 2021-09-12 ENCOUNTER — Ambulatory Visit: Payer: PRIVATE HEALTH INSURANCE | Admitting: Gastroenterology

## 2021-09-12 ENCOUNTER — Encounter: Payer: Self-pay | Admitting: Internal Medicine

## 2023-12-06 ENCOUNTER — Encounter: Payer: Self-pay | Admitting: Gastroenterology

## 2023-12-10 ENCOUNTER — Encounter: Payer: Self-pay | Admitting: Gastroenterology

## 2023-12-10 ENCOUNTER — Ambulatory Visit (INDEPENDENT_AMBULATORY_CARE_PROVIDER_SITE_OTHER): Admitting: Gastroenterology

## 2023-12-10 VITALS — BP 124/84 | HR 87 | Temp 99.8°F | Ht 74.0 in | Wt 327.2 lb

## 2023-12-10 DIAGNOSIS — K5900 Constipation, unspecified: Secondary | ICD-10-CM

## 2023-12-10 DIAGNOSIS — K429 Umbilical hernia without obstruction or gangrene: Secondary | ICD-10-CM

## 2023-12-10 DIAGNOSIS — R748 Abnormal levels of other serum enzymes: Secondary | ICD-10-CM | POA: Diagnosis not present

## 2023-12-10 DIAGNOSIS — K625 Hemorrhage of anus and rectum: Secondary | ICD-10-CM

## 2023-12-10 NOTE — H&P (View-Only) (Signed)
 GI Office Note    Referring Provider: Lari Elspeth BRAVO, MD Primary Care Physician:  Lari Elspeth BRAVO, MD  Primary Gastroenterologist: Ozell Hollingshead, MD   Chief Complaint   Chief Complaint  Patient presents with   Rectal Bleeding     History of Present Illness   Patrick Chandler is a 43 y.o. male presenting today at the request of Dr. Lari for rectal bleeding. He also has history of chronic elevation of amylase which was initially found when patient presented with pancreatitis several years back, lipase significantly elevated at that time. He has known cholelithaisis. He was seen by oncology for chronic elevation of amlyase. Advised to have EGD. This was never completed.   Discussed the use of AI scribe software for clinical note transcription with the patient, who gave verbal consent to proceed.    He experiences bright red rectal bleeding with each bowel movement for the past week, with blood mixed in the stool and significant amounts in the toilet bowl. He is concerned due to his father's recent discovery of precancerous polyps. He has had intermittent brbpr for some time now, but more regularly lately. He has noted some increased constipation since starting Mounjaro this past week. Usually has 2-3 stools daily but did not have BM for 24 hours after his first shot. He does recall recently having a painful stool. Otherwise unware of any hemorrhoids.    He has bloating and a sensation of tightness in the abdomen. He has umbilical hernia that he is seeing surgery about. Anytime he leans again counter it is tender. In the past he has had prior episodes of pancreatitis with two previous hospitalizations and consistently elevated amylase levels. Has seen oncology and was referred to us  in 2023 for EGD but he did not keep appt.    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 He also has significant grief, with his 50 month old infant son passing away in  09/2023. He struggles with maintaining a healthy diet and exercise routine, contributing to his weight of 325-330 pounds. He does not consume alcohol regularly or smoke.       Prior Data   11/2023: lipase 24 10/2022: White blood cell count 6.7, hemoglobin 14.5, platelets 345, creatinine 1.5, eGFR 59, albumin 3.2, total bilirubin 0.4, AP 94, AST 22, ALT 37 07/2019: Amylase 914, total bilirubin 1.4, alkaline phosphatase 134, AST 195, ALT 164, lipase 4636 04/2019: Total bilirubin 0.4, alk phos 137, AST 77, ALT 117, hepatitis B surface antigen negative, hepatitis A IgM negative, hepatitis B core IgM negative, hepatitis C virus antibody negative, TTG IgA less than 2, IgA 527, amylase 838, lipase 27, ANA negative, LFTs normal,  Amylase in 700-800 range, last checked in 2023  CT A/P without contrast 07/2019: -cholelithiasis -minimal fatty infiltration of liver -mild acute pancreatitis -rectal wall minimally prominent, suspect artifact related to underdistention  Medications   Current Outpatient Medications  Medication Sig Dispense Refill   indomethacin (INDOCIN) 50 MG capsule Take 50 mg by mouth 3 (three) times daily as needed.     lisinopril-hydrochlorothiazide (ZESTORETIC) 10-12.5 MG tablet Take 1 tablet by mouth daily.     MOUNJARO 2.5 MG/0.5ML Pen Inject 2.5 mg into the skin once a week.     allopurinol (ZYLOPRIM) 300 MG tablet Take 300 mg by mouth daily.     No current facility-administered medications for this visit.    Allergies   Allergies as of 12/10/2023   (No Known Allergies)  Past Medical History   Past Medical History:  Diagnosis Date   Diabetes (HCC)    DVT (deep venous thrombosis) (HCC)    Fatty liver    Gout    HTN (hypertension)    Umbilical hernia     Past Surgical History   No past surgical history on file.  Past Family History   Family History  Problem Relation Age of Onset   Colon polyps Father        16    Past Social History   Social History    Socioeconomic History   Marital status: Married    Spouse name: Not on file   Number of children: Not on file   Years of education: Not on file   Highest education level: Not on file  Occupational History   Not on file  Tobacco Use   Smoking status: Never   Smokeless tobacco: Never  Substance and Sexual Activity   Alcohol use: Not Currently   Drug use: Never   Sexual activity: Not on file  Other Topics Concern   Not on file  Social History Narrative   Not on file   Social Drivers of Health   Financial Resource Strain: Low Risk  (05/05/2019)   Received from [facility]   Overall Financial Resource Strain (CARDIA)    Difficulty of Paying Living Expenses: Not hard at all  Food Insecurity: No Food Insecurity (05/05/2019)   Received from [facility]   Hunger Vital Sign    Within the past 12 months, you worried that your food would run out before you got the money to buy more.: Never true    Within the past 12 months, the food you bought just didn't last and you didn't have money to get more.: Never true  Transportation Needs: No Transportation Needs (05/05/2019)   Received from [facility]   PRAPARE - Transportation    Lack of Transportation (Medical): No    Lack of Transportation (Non-Medical): No  Physical Activity: Inactive (05/05/2019)   Received from [facility]   Exercise Vital Sign    On average, how many days per week do you engage in moderate to strenuous exercise (like a brisk walk)?: 0 days    On average, how many minutes do you engage in exercise at this level?: 0 min  Stress: No Stress Concern Present (05/05/2019)   Received from [facility] - Occupational Stress Questionnaire    Feeling of Stress : Only a little  Social Connections: Moderately Isolated (05/05/2019)   Received from [facility]   Social Connection and Isolation Panel    In a typical week, how many times do you talk on the phone with  family, friends, or neighbors?: More than three times a week    How often do you get together with friends or relatives?: Once a week    How often do you attend church or religious services?: Never    Do you belong to any clubs or organizations such as church groups, unions, fraternal or athletic groups, or school groups?: No    How often do you attend meetings of the clubs or organizations you belong to?: Never    Are you married, widowed, divorced, separated, never married, or living with a partner?: Married  Intimate Partner Violence: Not At Risk (05/05/2019)   Received from [facility]   Humiliation, Afraid, Rape, and Kick questionnaire  Within the last year, have you been afraid of your partner or ex-partner?: No    Within the last year, have you been humiliated or emotionally abused in other ways by your partner or ex-partner?: No    Within the last year, have you been kicked, hit, slapped, or otherwise physically hurt by your partner or ex-partner?: No    Within the last year, have you been raped or forced to have any kind of sexual activity by your partner or ex-partner?: No    Review of Systems   General: Negative for anorexia, weight loss, fever, chills, fatigue, weakness. Eyes: Negative for vision changes.  ENT: Negative for hoarseness, difficulty swallowing , nasal congestion. CV: Negative for chest pain, angina, palpitations, dyspnea on exertion, peripheral edema.  Respiratory: Negative for dyspnea at rest, dyspnea on exertion, cough, sputum, wheezing.  GI: See history of present illness. GU:  Negative for dysuria, hematuria, urinary incontinence, urinary frequency, nocturnal urination.  MS: Negative for joint pain, low back pain.  Derm: Negative for rash or itching.  Neuro: Negative for weakness, abnormal sensation, seizure, frequent headaches, memory loss,  confusion.  Psych: Negative for anxiety, depression, suicidal ideation, hallucinations.  Endo: Negative for  unusual weight change.  Heme: Negative for bruising or bleeding. Allergy: Negative for rash or hives.  Physical Exam   BP 124/84 (BP Location: Right Arm, Patient Position: Sitting, Cuff Size: Large)   Pulse 87   Temp 99.8 F (37.7 C) (Oral)   Ht 6' 2 (1.88 m)   Wt (!) 327 lb 3.2 oz (148.4 kg)   SpO2 97%   BMI 42.01 kg/m    General: Well-nourished, well-developed in no acute distress.  Head: Normocephalic, atraumatic.   Eyes: Conjunctiva pink, no icterus. Mouth: Oropharyngeal mucosa moist and pink  Neck: Supple without thyromegaly, masses, or lymphadenopathy.  Lungs: Clear to auscultation bilaterally.  Heart: Regular rate and rhythm, no murmurs rubs or gallops.  Abdomen: Bowel sounds are normal, nontender, distended, no hepatosplenomegaly or masses,  no abdominal bruits, no rebound or guarding. +umb hernia easily reducible nontender  Rectal: not performed Extremities: No lower extremity edema. No clubbing or deformities.  Neuro: Alert and oriented x 4 , grossly normal neurologically.  Skin: Warm and dry, no rash or jaundice.   Psych: Alert and cooperative, normal mood and affect.  Labs   See above Imaging Studies   No results found.  Assessment/Plan:    Rectal bleeding: Intermittent brbpr, more persistent lately. Episode of painful defecation, possible anal fissure. Recently started Mounjaro, noted going 24 hours without a stool, typically has 2-3 stools daily. Bright red rectal bleeding could be due to fissure, hemorrhoids. Cannot rule out other etiologies like malignancy, less likely IBD. He has FH (father) of precancerous polyps. - colonoscopy. ASA 3.  I have discussed the risks, alternatives, benefits with regards to but not limited to the risk of reaction to medication, bleeding, infection, perforation and the patient is agreeable to proceed. Written consent to be obtained. - Hold Mounjaro injection 7 days prior to colonoscopy.   Recent mild constipation: -likely  secondary to Mounjaro -increase water and fiber -increase physical activity -add miralax 1-2 capfuls daily if needed  Abdominal pain/umbilical hernia: He reports abdominal discomfort at site of umbilical hernia when pressure applied. Sees general surgery in the near future.   History of pancreatitis Two past episodes of pancreatitis in past per patient. Well documented episode in 07/2019 with lipase of 4636 and CT findings of mild pancreatitis. Suspected cholelithiasis on  CT at that time. His Tbili was 1.4, AP 134, AST 195, ALT 164. He has had significantly elevated amylase that has been checked several times and persistently elevated with normal lipase and in setting of no abdominal pain. I suspect he has had biliary pancreatitis in 07/2019. See isolated elevated amylase below.   Isolated elevated amylase: Generally can be caused by salivary disease, macromylasemia, idiopathic hyperamylasemia. With macroamylasemia - Serum amylase levels may be elevated in settings in which amylase is bound to other macromolecules like immunoglobulins and polysaccharides, forming complexes known as macroamylase. Has been described in celiac disease, HIV infection, lymphoma, ulcerative colitis, rheumatoid arthritis, and monoclonal gammopathy. Because of the size of these complexes, renal excretion is reduced and the amylase level as measured by serologic tests is increased. Such patients typically have chronically elevated serum amylase levels, although the degree of elevation can fluctuate.   -His amylase has been persistently elevated. He has had work up with hem/onc in 2021 with negative TTG IgA, HIV, ANA, RF, negative leukemia/lymphoma panel, IgA was elevated at 527, no Mspike.  -suspect benign etiology -check amylase isoenzymes with reflex to macroamylase     Sonny RAMAN. Ezzard, MHS, PA-C [facility]

## 2023-12-10 NOTE — Patient Instructions (Signed)
 We will be in touch to schedule colonoscopy.   I will review your records regarding your pancreas and labs and be in touch with recommendations.

## 2023-12-10 NOTE — Progress Notes (Signed)
 GI Office Note    Referring Provider: Lari Elspeth BRAVO, MD Primary Care Physician:  Lari Elspeth BRAVO, MD  Primary Gastroenterologist: Ozell Hollingshead, MD   Chief Complaint   Chief Complaint  Patient presents with   Rectal Bleeding     History of Present Illness   Patrick Chandler is a 43 y.o. male presenting today at the request of Dr. Lari for rectal bleeding. He also has history of chronic elevation of amylase which was initially found when patient presented with pancreatitis several years back, lipase significantly elevated at that time. He has known cholelithaisis. He was seen by oncology for chronic elevation of amlyase. Advised to have EGD. This was never completed.   Discussed the use of AI scribe software for clinical note transcription with the patient, who gave verbal consent to proceed.    He experiences bright red rectal bleeding with each bowel movement for the past week, with blood mixed in the stool and significant amounts in the toilet bowl. He is concerned due to his father's recent discovery of precancerous polyps. He has had intermittent brbpr for some time now, but more regularly lately. He has noted some increased constipation since starting Mounjaro this past week. Usually has 2-3 stools daily but did not have BM for 24 hours after his first shot. He does recall recently having a painful stool. Otherwise unware of any hemorrhoids.    He has bloating and a sensation of tightness in the abdomen. He has umbilical hernia that he is seeing surgery about. Anytime he leans again counter it is tender. In the past he has had prior episodes of pancreatitis with two previous hospitalizations and consistently elevated amylase levels. Has seen oncology and was referred to us  in 2023 for EGD but he did not keep appt.    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 He also has significant grief, with his 50 month old infant son passing away in  09/2023. He struggles with maintaining a healthy diet and exercise routine, contributing to his weight of 325-330 pounds. He does not consume alcohol regularly or smoke.       Prior Data   11/2023: lipase 24 10/2022: White blood cell count 6.7, hemoglobin 14.5, platelets 345, creatinine 1.5, eGFR 59, albumin 3.2, total bilirubin 0.4, AP 94, AST 22, ALT 37 07/2019: Amylase 914, total bilirubin 1.4, alkaline phosphatase 134, AST 195, ALT 164, lipase 4636 04/2019: Total bilirubin 0.4, alk phos 137, AST 77, ALT 117, hepatitis B surface antigen negative, hepatitis A IgM negative, hepatitis B core IgM negative, hepatitis C virus antibody negative, TTG IgA less than 2, IgA 527, amylase 838, lipase 27, ANA negative, LFTs normal,  Amylase in 700-800 range, last checked in 2023  CT A/P without contrast 07/2019: -cholelithiasis -minimal fatty infiltration of liver -mild acute pancreatitis -rectal wall minimally prominent, suspect artifact related to underdistention  Medications   Current Outpatient Medications  Medication Sig Dispense Refill   indomethacin (INDOCIN) 50 MG capsule Take 50 mg by mouth 3 (three) times daily as needed.     lisinopril-hydrochlorothiazide (ZESTORETIC) 10-12.5 MG tablet Take 1 tablet by mouth daily.     MOUNJARO 2.5 MG/0.5ML Pen Inject 2.5 mg into the skin once a week.     allopurinol (ZYLOPRIM) 300 MG tablet Take 300 mg by mouth daily.     No current facility-administered medications for this visit.    Allergies   Allergies as of 12/10/2023   (No Known Allergies)  Past Medical History   Past Medical History:  Diagnosis Date   Diabetes (HCC)    DVT (deep venous thrombosis) (HCC)    Fatty liver    Gout    HTN (hypertension)    Umbilical hernia     Past Surgical History   No past surgical history on file.  Past Family History   Family History  Problem Relation Age of Onset   Colon polyps Father        16    Past Social History   Social History    Socioeconomic History   Marital status: Married    Spouse name: Not on file   Number of children: Not on file   Years of education: Not on file   Highest education level: Not on file  Occupational History   Not on file  Tobacco Use   Smoking status: Never   Smokeless tobacco: Never  Substance and Sexual Activity   Alcohol use: Not Currently   Drug use: Never   Sexual activity: Not on file  Other Topics Concern   Not on file  Social History Narrative   Not on file   Social Drivers of Health   Financial Resource Strain: Low Risk  (05/05/2019)   Received from [facility]   Overall Financial Resource Strain (CARDIA)    Difficulty of Paying Living Expenses: Not hard at all  Food Insecurity: No Food Insecurity (05/05/2019)   Received from [facility]   Hunger Vital Sign    Within the past 12 months, you worried that your food would run out before you got the money to buy more.: Never true    Within the past 12 months, the food you bought just didn't last and you didn't have money to get more.: Never true  Transportation Needs: No Transportation Needs (05/05/2019)   Received from [facility]   PRAPARE - Transportation    Lack of Transportation (Medical): No    Lack of Transportation (Non-Medical): No  Physical Activity: Inactive (05/05/2019)   Received from [facility]   Exercise Vital Sign    On average, how many days per week do you engage in moderate to strenuous exercise (like a brisk walk)?: 0 days    On average, how many minutes do you engage in exercise at this level?: 0 min  Stress: No Stress Concern Present (05/05/2019)   Received from [facility] - Occupational Stress Questionnaire    Feeling of Stress : Only a little  Social Connections: Moderately Isolated (05/05/2019)   Received from [facility]   Social Connection and Isolation Panel    In a typical week, how many times do you talk on the phone with  family, friends, or neighbors?: More than three times a week    How often do you get together with friends or relatives?: Once a week    How often do you attend church or religious services?: Never    Do you belong to any clubs or organizations such as church groups, unions, fraternal or athletic groups, or school groups?: No    How often do you attend meetings of the clubs or organizations you belong to?: Never    Are you married, widowed, divorced, separated, never married, or living with a partner?: Married  Intimate Partner Violence: Not At Risk (05/05/2019)   Received from [facility]   Humiliation, Afraid, Rape, and Kick questionnaire  Within the last year, have you been afraid of your partner or ex-partner?: No    Within the last year, have you been humiliated or emotionally abused in other ways by your partner or ex-partner?: No    Within the last year, have you been kicked, hit, slapped, or otherwise physically hurt by your partner or ex-partner?: No    Within the last year, have you been raped or forced to have any kind of sexual activity by your partner or ex-partner?: No    Review of Systems   General: Negative for anorexia, weight loss, fever, chills, fatigue, weakness. Eyes: Negative for vision changes.  ENT: Negative for hoarseness, difficulty swallowing , nasal congestion. CV: Negative for chest pain, angina, palpitations, dyspnea on exertion, peripheral edema.  Respiratory: Negative for dyspnea at rest, dyspnea on exertion, cough, sputum, wheezing.  GI: See history of present illness. GU:  Negative for dysuria, hematuria, urinary incontinence, urinary frequency, nocturnal urination.  MS: Negative for joint pain, low back pain.  Derm: Negative for rash or itching.  Neuro: Negative for weakness, abnormal sensation, seizure, frequent headaches, memory loss,  confusion.  Psych: Negative for anxiety, depression, suicidal ideation, hallucinations.  Endo: Negative for  unusual weight change.  Heme: Negative for bruising or bleeding. Allergy: Negative for rash or hives.  Physical Exam   BP 124/84 (BP Location: Right Arm, Patient Position: Sitting, Cuff Size: Large)   Pulse 87   Temp 99.8 F (37.7 C) (Oral)   Ht 6' 2 (1.88 m)   Wt (!) 327 lb 3.2 oz (148.4 kg)   SpO2 97%   BMI 42.01 kg/m    General: Well-nourished, well-developed in no acute distress.  Head: Normocephalic, atraumatic.   Eyes: Conjunctiva pink, no icterus. Mouth: Oropharyngeal mucosa moist and pink  Neck: Supple without thyromegaly, masses, or lymphadenopathy.  Lungs: Clear to auscultation bilaterally.  Heart: Regular rate and rhythm, no murmurs rubs or gallops.  Abdomen: Bowel sounds are normal, nontender, distended, no hepatosplenomegaly or masses,  no abdominal bruits, no rebound or guarding. +umb hernia easily reducible nontender  Rectal: not performed Extremities: No lower extremity edema. No clubbing or deformities.  Neuro: Alert and oriented x 4 , grossly normal neurologically.  Skin: Warm and dry, no rash or jaundice.   Psych: Alert and cooperative, normal mood and affect.  Labs   See above Imaging Studies   No results found.  Assessment/Plan:    Rectal bleeding: Intermittent brbpr, more persistent lately. Episode of painful defecation, possible anal fissure. Recently started Mounjaro, noted going 24 hours without a stool, typically has 2-3 stools daily. Bright red rectal bleeding could be due to fissure, hemorrhoids. Cannot rule out other etiologies like malignancy, less likely IBD. He has FH (father) of precancerous polyps. - colonoscopy. ASA 3.  I have discussed the risks, alternatives, benefits with regards to but not limited to the risk of reaction to medication, bleeding, infection, perforation and the patient is agreeable to proceed. Written consent to be obtained. - Hold Mounjaro injection 7 days prior to colonoscopy.   Recent mild constipation: -likely  secondary to Mounjaro -increase water and fiber -increase physical activity -add miralax 1-2 capfuls daily if needed  Abdominal pain/umbilical hernia: He reports abdominal discomfort at site of umbilical hernia when pressure applied. Sees general surgery in the near future.   History of pancreatitis Two past episodes of pancreatitis in past per patient. Well documented episode in 07/2019 with lipase of 4636 and CT findings of mild pancreatitis. Suspected cholelithiasis on  CT at that time. His Tbili was 1.4, AP 134, AST 195, ALT 164. He has had significantly elevated amylase that has been checked several times and persistently elevated with normal lipase and in setting of no abdominal pain. I suspect he has had biliary pancreatitis in 07/2019. See isolated elevated amylase below.   Isolated elevated amylase: Generally can be caused by salivary disease, macromylasemia, idiopathic hyperamylasemia. With macroamylasemia - Serum amylase levels may be elevated in settings in which amylase is bound to other macromolecules like immunoglobulins and polysaccharides, forming complexes known as macroamylase. Has been described in celiac disease, HIV infection, lymphoma, ulcerative colitis, rheumatoid arthritis, and monoclonal gammopathy. Because of the size of these complexes, renal excretion is reduced and the amylase level as measured by serologic tests is increased. Such patients typically have chronically elevated serum amylase levels, although the degree of elevation can fluctuate.   -His amylase has been persistently elevated. He has had work up with hem/onc in 2021 with negative TTG IgA, HIV, ANA, RF, negative leukemia/lymphoma panel, IgA was elevated at 527, no Mspike.  -suspect benign etiology -check amylase isoenzymes with reflex to macroamylase     Sonny RAMAN. Ezzard, MHS, PA-C [facility]

## 2023-12-11 DIAGNOSIS — K625 Hemorrhage of anus and rectum: Secondary | ICD-10-CM | POA: Insufficient documentation

## 2023-12-11 DIAGNOSIS — R109 Unspecified abdominal pain: Secondary | ICD-10-CM | POA: Insufficient documentation

## 2023-12-11 DIAGNOSIS — R748 Abnormal levels of other serum enzymes: Secondary | ICD-10-CM | POA: Insufficient documentation

## 2023-12-14 ENCOUNTER — Telehealth: Payer: Self-pay | Admitting: Gastroenterology

## 2023-12-14 NOTE — Telephone Encounter (Signed)
 Patient recently seen in office for rectal bleeding and elevated amylase.  Tammy R/Mindy: Please schedule colonoscopy with Dr. Shaaron.  ASA 3. Rm 1,2 ok. Hold mounjaro 7 days.  FYI, he is going for umb hernia repair soon and wants to get colonoscopy before surgery   I reviewed his previous work up of pancreatitis.   Two past episodes of pancreatitis in past per patient. Well documented episode in 07/2019 with lipase of 4636 and CT findings of mild pancreatitis. Suspected cholelithiasis on CT at that time. His Tbili was 1.4, AP 134, AST 195, ALT 164. He has had significantly elevated amylase that has been checked several times and persistently elevated with normal lipase and in setting of no abdominal pain. I suspect he has had biliary pancreatitis in 07/2019.   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  His previous hem/onc provider ruled out common causes ie celiac disease, HIV infection, lymphoma, ulcerative colitis, rheumatoid arthritis, and monoclonal gammopathy.    TAMMY C: -check amylase isoenzymes with reflex to macroamylase -I placed with [facility]

## 2023-12-17 NOTE — Telephone Encounter (Addendum)
Called pt, no answer and VM full

## 2023-12-17 NOTE — Telephone Encounter (Signed)
 Pt was made aware and verbalized understanding. Pt stated that he would try to get to the lab on Wed.

## 2023-12-18 MED ORDER — PEG 3350-KCL-NA BICARB-NACL 420 G PO SOLR: 4000.0000 mL | Freq: Once | ORAL | 0 refills | Status: AC

## 2023-12-18 NOTE — Addendum Note (Signed)
 Addended by: JEANELL GRAEME RAMAN on: 12/18/2023 09:21 AM   Modules accepted: Orders

## 2023-12-18 NOTE — Telephone Encounter (Addendum)
 Spoke with pt. He has been scheduled for 10/27. I have sent a link to him to sign up for his mychart so I can send his instructions. Aware will send rx for prep to his pharmacy for pick up. Aware not to take Sunday dose of Mounjaro.   Checked carelon for PA not required

## 2023-12-21 ENCOUNTER — Other Ambulatory Visit: Payer: Self-pay | Admitting: *Deleted

## 2023-12-21 DIAGNOSIS — K429 Umbilical hernia without obstruction or gangrene: Secondary | ICD-10-CM

## 2023-12-24 ENCOUNTER — Encounter (HOSPITAL_COMMUNITY): Payer: Self-pay | Admitting: Internal Medicine

## 2023-12-24 ENCOUNTER — Ambulatory Visit (HOSPITAL_COMMUNITY): Admitting: Certified Registered"

## 2023-12-24 ENCOUNTER — Other Ambulatory Visit: Payer: Self-pay

## 2023-12-24 ENCOUNTER — Ambulatory Visit (HOSPITAL_COMMUNITY)
Admission: RE | Admit: 2023-12-24 | Discharge: 2023-12-24 | Disposition: A | Discharge status: Home / Self Care | Attending: Internal Medicine | Admitting: Internal Medicine

## 2023-12-24 ENCOUNTER — Encounter (HOSPITAL_COMMUNITY)
Admission: RE | Disposition: A | Discharge status: Home / Self Care | Payer: Self-pay | Source: Home / Self Care | Attending: Internal Medicine

## 2023-12-24 DIAGNOSIS — Z59869 Financial insecurity, unspecified: Secondary | ICD-10-CM | POA: Insufficient documentation

## 2023-12-24 DIAGNOSIS — Z634 Disappearance and death of family member: Secondary | ICD-10-CM | POA: Insufficient documentation

## 2023-12-24 DIAGNOSIS — K429 Umbilical hernia without obstruction or gangrene: Secondary | ICD-10-CM | POA: Diagnosis not present

## 2023-12-24 DIAGNOSIS — R748 Abnormal levels of other serum enzymes: Secondary | ICD-10-CM | POA: Insufficient documentation

## 2023-12-24 DIAGNOSIS — Z8719 Personal history of other diseases of the digestive system: Secondary | ICD-10-CM | POA: Diagnosis not present

## 2023-12-24 DIAGNOSIS — Z6841 Body Mass Index (BMI) 40.0 and over, adult: Secondary | ICD-10-CM | POA: Insufficient documentation

## 2023-12-24 DIAGNOSIS — K640 First degree hemorrhoids: Secondary | ICD-10-CM

## 2023-12-24 DIAGNOSIS — E66813 Obesity, class 3: Secondary | ICD-10-CM | POA: Insufficient documentation

## 2023-12-24 DIAGNOSIS — Z7985 Long-term (current) use of injectable non-insulin antidiabetic drugs: Secondary | ICD-10-CM | POA: Diagnosis not present

## 2023-12-24 DIAGNOSIS — K644 Residual hemorrhoidal skin tags: Secondary | ICD-10-CM | POA: Insufficient documentation

## 2023-12-24 DIAGNOSIS — Z566 Other physical and mental strain related to work: Secondary | ICD-10-CM | POA: Diagnosis not present

## 2023-12-24 DIAGNOSIS — K921 Melena: Secondary | ICD-10-CM | POA: Diagnosis present

## 2023-12-24 DIAGNOSIS — K625 Hemorrhage of anus and rectum: Secondary | ICD-10-CM

## 2023-12-24 DIAGNOSIS — K5900 Constipation, unspecified: Secondary | ICD-10-CM | POA: Insufficient documentation

## 2023-12-24 DIAGNOSIS — E119 Type 2 diabetes mellitus without complications: Secondary | ICD-10-CM | POA: Insufficient documentation

## 2023-12-24 DIAGNOSIS — I10 Essential (primary) hypertension: Secondary | ICD-10-CM | POA: Diagnosis not present

## 2023-12-24 DIAGNOSIS — Z83718 Family history of other colon polyps: Secondary | ICD-10-CM | POA: Insufficient documentation

## 2023-12-24 HISTORY — PX: COLONOSCOPY: SHX5424

## 2023-12-24 SURGERY — COLONOSCOPY
Anesthesia: General

## 2023-12-24 MED ORDER — PROPOFOL 10 MG/ML IV BOLUS
INTRAVENOUS | Status: DC | PRN
  Administered 2023-12-24: 150 mg via INTRAVENOUS
  Administered 2023-12-24: 125 ug/kg/min via INTRAVENOUS

## 2023-12-24 MED ORDER — LIDOCAINE 2% (20 MG/ML) 5 ML SYRINGE
INTRAMUSCULAR | Status: DC | PRN
  Administered 2023-12-24: 80 mg via INTRAVENOUS

## 2023-12-24 MED ORDER — LACTATED RINGERS IV SOLN: INTRAVENOUS | Status: DC | PRN

## 2023-12-24 MED ORDER — LACTATED RINGERS IV SOLN: INTRAVENOUS | Status: DC

## 2023-12-24 NOTE — Interval H&P Note (Signed)
 History and Physical Interval Note:  12/24/2023 12:57 PM  Patrick Chandler  has presented today for surgery, with the diagnosis of RECTAL BLEEDING.  The various methods of treatment have been discussed with the patient and family. After consideration of risks, benefits and other options for treatment, the patient has consented to  Procedure(s) with comments: COLONOSCOPY (N/A) - 1:15PM, OK RM 1 as a surgical intervention.  The patient's history has been reviewed, patient examined, no change in status, stable for surgery.  I have reviewed the patient's chart and labs.  Questions were answered to the patient's satisfaction.       No change.  Diagnostic colonoscopy per plan.  The risks, benefits, limitations, alternatives and imponderables have been reviewed with the patient. Questions have been answered. All parties are agreeable.    Lamar Hollingshead

## 2023-12-24 NOTE — Anesthesia Preprocedure Evaluation (Signed)
 Anesthesia Evaluation  Patient identified by MRN, date of birth, ID band Patient awake    Reviewed: Allergy & Precautions, H&P , NPO status , Patient's Chart, lab work & pertinent test results  Airway Mallampati: II  TM Distance: >3 FB Neck ROM: Full    Dental no notable dental hx.    Pulmonary neg pulmonary ROS   Pulmonary exam normal breath sounds clear to auscultation       Cardiovascular hypertension, negative cardio ROS Normal cardiovascular exam Rhythm:Regular Rate:Normal     Neuro/Psych negative neurological ROS  negative psych ROS   GI/Hepatic negative GI ROS, Neg liver ROS,,,  Endo/Other  diabetes  Class 3 obesity  Renal/GU negative Renal ROS  negative genitourinary   Musculoskeletal negative musculoskeletal ROS (+)    Abdominal   Peds negative pediatric ROS (+)  Hematology negative hematology ROS (+)   Anesthesia Other Findings   Reproductive/Obstetrics negative OB ROS                              Anesthesia Physical Anesthesia Plan  ASA: 2  Anesthesia Plan: General   Post-op Pain Management:    Induction: Intravenous  PONV Risk Score and Plan:   Airway Management Planned: Nasal Cannula  Additional Equipment:   Intra-op Plan:   Post-operative Plan:   Informed Consent: I have reviewed the patients History and Physical, chart, labs and discussed the procedure including the risks, benefits and alternatives for the proposed anesthesia with the patient or authorized representative who has indicated his/her understanding and acceptance.     Dental advisory given  Plan Discussed with: CRNA  Anesthesia Plan Comments:         Anesthesia Quick Evaluation

## 2023-12-24 NOTE — Transfer of Care (Signed)
 Immediate Anesthesia Transfer of Care Note  Patient: Patrick Chandler  Procedure(s) Performed: COLONOSCOPY  Patient Location: PACU  Anesthesia Type:General  Level of Consciousness: awake, alert , oriented, and patient cooperative  Airway & Oxygen Therapy: Patient Spontanous Breathing and Patient connected to nasal cannula oxygen  Post-op Assessment: Report given to RN, Post -op Vital signs reviewed and stable, and Patient moving all extremities X 4  Post vital signs: Reviewed and stable  Last Vitals:  Vitals Value Taken Time  BP 86/52 12/24/23 13:27  Temp 36.9 C 12/24/23 13:27  Pulse 102 12/24/23 13:27  Resp 19 12/24/23 13:27  SpO2 96 % 12/24/23 13:27    Last Pain:  Vitals:   12/24/23 1327  TempSrc: Oral  PainSc: 0-No pain      Patients Stated Pain Goal: 4 (12/24/23 1216)  Complications: No notable events documented.

## 2023-12-24 NOTE — Discharge Instructions (Addendum)
  Colonoscopy Discharge Instructions  Read the instructions outlined below and refer to this sheet in the next few weeks. These discharge instructions provide you with general information on caring for yourself after you leave the hospital. Your doctor may also give you specific instructions. While your treatment has been planned according to the most current medical practices available, unavoidable complications occasionally occur. If you have any problems or questions after discharge, call Dr. Shaaron at [phone]. ACTIVITY You may resume your regular activity, but move at a slower pace for the next 24 hours.  Take frequent rest periods for the next 24 hours.  Walking will help get rid of the air and reduce the bloated feeling in your belly (abdomen).  No driving for 24 hours (because of the medicine (anesthesia) used during the test).   Do not sign any important legal documents or operate any machinery for 24 hours (because of the anesthesia used during the test).  NUTRITION Drink plenty of fluids.  You may resume your normal diet as instructed by your doctor.  Begin with a light meal and progress to your normal diet. Heavy or fried foods are harder to digest and may make you feel sick to your stomach (nauseated).  Avoid alcoholic beverages for 24 hours or as instructed.  MEDICATIONS You may resume your normal medications unless your doctor tells you otherwise.  WHAT YOU CAN EXPECT TODAY Some feelings of bloating in the abdomen.  Passage of more gas than usual.  Spotting of blood in your stool or on the toilet paper.  IF YOU HAD POLYPS REMOVED DURING THE COLONOSCOPY: No aspirin products for 7 days or as instructed.  No alcohol for 7 days or as instructed.  Eat a soft diet for the next 24 hours.  FINDING OUT THE RESULTS OF YOUR TEST Not all test results are available during your visit. If your test results are not back during the visit, make an appointment with your caregiver to find out the  results. Do not assume everything is normal if you have not heard from your caregiver or the medical facility. It is important for you to follow up on all of your test results.  SEEK IMMEDIATE MEDICAL ATTENTION IF: You have more than a spotting of blood in your stool.  Your belly is swollen (abdominal distention).  You are nauseated or vomiting.  You have a temperature over 101.  You have abdominal pain or discomfort that is severe or gets worse throughout the day.    You do have hemorrhoids but they are not that large.  The remainder of your rectum and colon appeared normal.  You likely bled intermittently from hemorrhoids  Hemorrhoid literature and hemorrhoid banding pamphlet provided  Would consider taking a daily fiber supplement such as Benefiber 1 tablespoon daily for 3 weeks then increase to 2 tablespoons daily thereafter  Repeat colonoscopy for high risk screening 5 years.  Follow-up appointment with us  in 6 weeks

## 2023-12-24 NOTE — Op Note (Signed)
 [facility] Patient Name: Patrick Chandler Procedure Date: 12/24/2023 12:33 PM MRN: [id number] Date of Birth: 1980-02-28 Attending MD: Lamar Ozell Hollingshead , MD, [id number] CSN: [id number] Age: 43 Admit Type: Outpatient Procedure:                Colonoscopy Indications:              Hematochezia Providers:                Lamar Ozell Hollingshead, MD, Leandrew Edelman RN, RN, Chad                            Wilson, Technician Referring MD:              Medicines:                Propofol per Anesthesia Complications:            No immediate complications. Estimated Blood Loss:     Estimated blood loss: none. Procedure:                Pre-Anesthesia Assessment:                           - Prior to the procedure, a History and Physical                            was performed, and patient medications and                            allergies were reviewed. The patient's tolerance of                            previous anesthesia was also reviewed. The risks                            and benefits of the procedure and the sedation                            options and risks were discussed with the patient.                            All questions were answered, and informed consent                            was obtained. Prior Anticoagulants: The patient has                            taken no anticoagulant or antiplatelet agents. ASA                            Grade Assessment: III - A patient with severe                            systemic disease. After reviewing the risks and  benefits, the patient was deemed in satisfactory                            condition to undergo the procedure.                           After obtaining informed consent, the colonoscope                            was passed under direct vision. Throughout the                            procedure, the patient's blood pressure, pulse, and                            oxygen saturations were  monitored continuously. The                            [id number] ([id number]) Colon was introduced through                            the anus and advanced to the the cecum, identified                            by appendiceal orifice and ileocecal valve. The                            colonoscopy was performed without difficulty. The                            patient tolerated the procedure well. The quality                            of the bowel preparation was adequate. The                            ileocecal valve, appendiceal orifice, and rectum                            were photographed. Scope In: 1:10:40 PM Scope Out: 1:22:38 PM Scope Withdrawal Time: 0 hours 7 minutes 11 seconds  Total Procedure Duration: 0 hours 11 minutes 58 seconds  Findings:      The perianal and digital rectal examinations were normal.      Non-bleeding external and internal hemorrhoids were found during       retroflexion. The hemorrhoids were mild, small and Grade I (internal       hemorrhoids that do not prolapse).      The exam was otherwise without abnormality on direct and retroflexion       views. Impression:               - Non-bleeding external and internal hemorrhoids.                           - The examination was otherwise normal on direct  and retroflexion views.                           - No specimens collected. I suspect benign                            anorectal bleeding from hemorrhoids. Moderate Sedation:      Moderate (conscious) sedation was personally administered by an       anesthesia professional. The following parameters were monitored: oxygen       saturation, heart rate, blood pressure, respiratory rate, EKG, adequacy       of pulmonary ventilation, and response to care. Recommendation:           - Patient has a contact number available for                            emergencies. The signs and symptoms of potential                             delayed complications were discussed with the                            patient. Return to normal activities tomorrow.                            Written discharge instructions were provided to the                            patient.                           - Advance diet as tolerated.                           - Continue present medications. Begin Benefiber                            daily.                           - Repeat colonoscopy in 5 years for screening                            purposes given fathers history of polyps.                           - Return to GI office in 6 weeks. Information on                            hemorrhoids and hemorrhoid banding provided to the                            patient Procedure Code(s):        --- Professional ---  54621, Colonoscopy, flexible; diagnostic, including                            collection of specimen(s) by brushing or washing,                            when performed (separate procedure) Diagnosis Code(s):        --- Professional ---                           K64.0, First degree hemorrhoids                           K92.1, Melena (includes Hematochezia) CPT copyright 2022 American Medical Association. All rights reserved. The codes documented in this report are preliminary and upon coder review may  be revised to meet current compliance requirements. Lamar HERO. Ishmel Acevedo, MD Lamar Ozell Hollingshead, MD 12/24/2023 1:52:21 PM This report has been signed electronically. Number of Addenda: 0

## 2023-12-24 NOTE — Anesthesia Postprocedure Evaluation (Signed)
 Anesthesia Post Note  Patient: Patrick Chandler  Procedure(s) Performed: COLONOSCOPY  Patient location during evaluation: PACU Anesthesia Type: General Level of consciousness: awake and alert Pain management: pain level controlled Vital Signs Assessment: post-procedure vital signs reviewed and stable Respiratory status: spontaneous breathing, nonlabored ventilation, respiratory function stable and patient connected to nasal cannula oxygen Cardiovascular status: blood pressure returned to baseline and stable Postop Assessment: no apparent nausea or vomiting Anesthetic complications: no   No notable events documented.   Last Vitals:  Vitals:   12/24/23 1331 12/24/23 1335  BP: 95/61 101/67  Pulse:  94  Resp:  20  Temp:    SpO2: 98% 100%    Last Pain:  Vitals:   12/24/23 1335  TempSrc:   PainSc: 0-No pain                 Andrea Limes

## 2023-12-25 ENCOUNTER — Encounter (HOSPITAL_COMMUNITY): Payer: Self-pay | Admitting: Internal Medicine

## 2023-12-27 ENCOUNTER — Ambulatory Visit (INDEPENDENT_AMBULATORY_CARE_PROVIDER_SITE_OTHER): Admitting: General Surgery

## 2023-12-27 ENCOUNTER — Encounter: Payer: Self-pay | Admitting: General Surgery

## 2023-12-27 VITALS — BP 118/79 | HR 76 | Temp 98.1°F | Resp 14 | Ht 74.0 in | Wt 321.0 lb

## 2023-12-27 DIAGNOSIS — K429 Umbilical hernia without obstruction or gangrene: Secondary | ICD-10-CM

## 2023-12-27 NOTE — Patient Instructions (Signed)
 Stop Mounjaro one week before surgery

## 2023-12-28 NOTE — Progress Notes (Signed)
 Patrick Chandler; [id number]; February 17, 1981   HPI Patient is a 43 year old white male who is referred to my care by Dr. Vanita for evaluation and treatment of an umbilical hernia.  Patient has had the umbilical hernia for some time.  It has increased in size.  He has had several episodes of pain when it has swelled.  It does reduce on its own when lying down.  It is made worse with straining. Past Medical History:  Diagnosis Date   Diabetes (HCC)    DVT (deep venous thrombosis) (HCC)    Fatty liver    Gout    HTN (hypertension)    Umbilical hernia     Past Surgical History:  Procedure Laterality Date   COLONOSCOPY N/A 12/24/2023   Procedure: COLONOSCOPY;  Surgeon: Shaaron Lamar HERO, MD;  Location: [facility];  Service: Endoscopy;  Laterality: N/A;  1:15PM, [place] RM 1    Family History  Problem Relation Age of Onset   Colon polyps Father        67    Current Outpatient Medications on File Prior to Visit  Medication Sig Dispense Refill   allopurinol (ZYLOPRIM) 300 MG tablet Take 300 mg by mouth daily.     indomethacin (INDOCIN) 50 MG capsule Take 50 mg by mouth 3 (three) times daily as needed.     lisinopril-hydrochlorothiazide (ZESTORETIC) 10-12.5 MG tablet Take 1 tablet by mouth daily.     MOUNJARO 2.5 MG/0.5ML Pen Inject 2.5 mg into the skin once a week.     No current facility-administered medications on file prior to visit.    No Known Allergies  Social History   Substance and Sexual Activity  Alcohol Use Not Currently    Social History   Tobacco Use  Smoking Status Never  Smokeless Tobacco Never    Review of Systems  Constitutional: Negative.   HENT: Negative.    Eyes: Negative.   Respiratory: Negative.    Cardiovascular: Negative.   Gastrointestinal: Negative.   Genitourinary: Negative.   Musculoskeletal: Negative.   Skin: Negative.   Neurological: Negative.   Endo/Heme/Allergies: Negative.   Psychiatric/Behavioral: Negative.      Objective   Vitals:    12/27/23 1009  BP: 118/79  Pulse: 76  Resp: 14  Temp: 98.1 F (36.7 C)  SpO2: 93%    Physical Exam Vitals reviewed.  Constitutional:      Appearance: Normal appearance. He is obese. He is not ill-appearing.  HENT:     Head: Normocephalic and atraumatic.  Cardiovascular:     Rate and Rhythm: Normal rate and regular rhythm.     Heart sounds: Normal heart sounds. No murmur heard.    No friction rub. No gallop.  Pulmonary:     Effort: Pulmonary effort is normal. No respiratory distress.     Breath sounds: Normal breath sounds. No stridor. No wheezing, rhonchi or rales.  Abdominal:     General: Bowel sounds are normal. There is no distension.     Palpations: Abdomen is soft. There is no mass.     Tenderness: There is no abdominal tenderness. There is no guarding or rebound.     Hernia: A hernia is present.     Comments: A 4 cm umbilical hernia with some thin skin overlying.  Reducible.  Skin:    General: Skin is warm and dry.  Neurological:     Mental Status: He is alert and oriented to person, place, and time.     Assessment  Umbilical hernia  Plan  Patient is scheduled for robotic assisted laparoscopic umbilical herniorrhaphy with mesh on 01/10/2024.  The risks and benefits of the procedure including bleeding, infection, mesh use, bowel injury, the possibility of recurrence of the hernia were fully explained to the patient, who gave informed consent.

## 2023-12-28 NOTE — H&P (Signed)
 Patrick Chandler; [id number]; February 17, 1981   HPI Patient is a 43 year old white male who is referred to my care by Dr. Vanita for evaluation and treatment of an umbilical hernia.  Patient has had the umbilical hernia for some time.  It has increased in size.  He has had several episodes of pain when it has swelled.  It does reduce on its own when lying down.  It is made worse with straining. Past Medical History:  Diagnosis Date   Diabetes (HCC)    DVT (deep venous thrombosis) (HCC)    Fatty liver    Gout    HTN (hypertension)    Umbilical hernia     Past Surgical History:  Procedure Laterality Date   COLONOSCOPY N/A 12/24/2023   Procedure: COLONOSCOPY;  Surgeon: Shaaron Lamar HERO, MD;  Location: [facility];  Service: Endoscopy;  Laterality: N/A;  1:15PM, OK RM 1    Family History  Problem Relation Age of Onset   Colon polyps Father        67    Current Outpatient Medications on File Prior to Visit  Medication Sig Dispense Refill   allopurinol (ZYLOPRIM) 300 MG tablet Take 300 mg by mouth daily.     indomethacin (INDOCIN) 50 MG capsule Take 50 mg by mouth 3 (three) times daily as needed.     lisinopril-hydrochlorothiazide (ZESTORETIC) 10-12.5 MG tablet Take 1 tablet by mouth daily.     MOUNJARO 2.5 MG/0.5ML Pen Inject 2.5 mg into the skin once a week.     No current facility-administered medications on file prior to visit.    No Known Allergies  Social History   Substance and Sexual Activity  Alcohol Use Not Currently    Social History   Tobacco Use  Smoking Status Never  Smokeless Tobacco Never    Review of Systems  Constitutional: Negative.   HENT: Negative.    Eyes: Negative.   Respiratory: Negative.    Cardiovascular: Negative.   Gastrointestinal: Negative.   Genitourinary: Negative.   Musculoskeletal: Negative.   Skin: Negative.   Neurological: Negative.   Endo/Heme/Allergies: Negative.   Psychiatric/Behavioral: Negative.      Objective   Vitals:    12/27/23 1009  BP: 118/79  Pulse: 76  Resp: 14  Temp: 98.1 F (36.7 C)  SpO2: 93%    Physical Exam Vitals reviewed.  Constitutional:      Appearance: Normal appearance. He is obese. He is not ill-appearing.  HENT:     Head: Normocephalic and atraumatic.  Cardiovascular:     Rate and Rhythm: Normal rate and regular rhythm.     Heart sounds: Normal heart sounds. No murmur heard.    No friction rub. No gallop.  Pulmonary:     Effort: Pulmonary effort is normal. No respiratory distress.     Breath sounds: Normal breath sounds. No stridor. No wheezing, rhonchi or rales.  Abdominal:     General: Bowel sounds are normal. There is no distension.     Palpations: Abdomen is soft. There is no mass.     Tenderness: There is no abdominal tenderness. There is no guarding or rebound.     Hernia: A hernia is present.     Comments: A 4 cm umbilical hernia with some thin skin overlying.  Reducible.  Skin:    General: Skin is warm and dry.  Neurological:     Mental Status: He is alert and oriented to person, place, and time.     Assessment  Umbilical hernia  Plan  Patient is scheduled for robotic assisted laparoscopic umbilical herniorrhaphy with mesh on 01/10/2024.  The risks and benefits of the procedure including bleeding, infection, mesh use, bowel injury, the possibility of recurrence of the hernia were fully explained to the patient, who gave informed consent.

## 2024-01-07 NOTE — Patient Instructions (Signed)
 Your procedure is scheduled on: 01/10/2024  Report to [facility] Main Entrance at 8:45    AM.  Call this number if you have problems the morning of surgery: [phone]   Remember:   Do not Eat after midnight, you may have CLEAR liquids until 6:45 am water, tea, soft drink, and/or BLACK coffee  NO CREAM OR MILK        No Smoking the morning of surgery  :  Take these medicines the morning of surgery with A SIP OF WATER: Allopurinol, indomethacin if needed   Do not wear jewelry, make-up or nail polish.  Do not wear lotions, powders, or perfumes. You may wear deodorant.  Do not shave 48 hours prior to surgery. Men may shave face and neck.  Do not bring valuables to the hospital.  Contacts, dentures or bridgework may not be worn into surgery.  Leave suitcase in the car. After surgery it may be brought to your room.  For patients admitted to the hospital, checkout time is 11:00 AM the day of discharge.   Patients discharged the day of surgery will not be allowed to drive home.    Special Instructions: Shower using CHG night before surgery and shower the day of surgery use CHG.  Use special wash - you have one bottle of CHG for all showers.  You should use approximately 1/2 of the bottle for each shower. How to Use Chlorhexidine at Home in the Shower Chlorhexidine gluconate (CHG) is a germ-killing (antiseptic) wash that's used to clean the skin. It can get rid of the germs that normally live on the skin and can keep them away for about 24 hours. If you're having surgery, you may be told to shower with CHG at home the night before surgery. This can help lower your risk for infection. To use CHG wash in the shower, follow the steps below. Supplies needed: CHG body wash. Clean washcloth. Clean towel. How to use CHG in the shower Follow these steps unless you're told to use CHG in a different way: Start the shower. Use your normal soap and shampoo to wash your face and hair. Turn off  the shower or move out of the shower stream. Pour CHG onto a clean washcloth. Do not use any type of brush or rough sponge. Start at your neck, washing your body down to your toes. Make sure you: Wash the part of your body where the surgery will be done for at least 1 minute. Do not scrub. Do not use CHG on your head or face unless your health care provider tells you to. If it gets into your ears or eyes, rinse them well with water. Do not wash your genitals with CHG. Wash your back and under your arms. Make sure to wash skin folds. Let the CHG sit on your skin for 1-2 minutes or as long as told. Rinse your entire body in the shower, including all body creases and folds. Turn off the shower. Dry off with a clean towel. Do not put anything on your skin afterward, such as powder, lotion, or perfume. Put on clean clothes or pajamas. If it's the night before surgery, sleep in clean sheets. General tips Use CHG only as told, and follow the instructions on the label. Use the full amount of CHG as told. This is often one bottle. Do not smoke and stay away from flames after using CHG. Your skin may feel sticky after using CHG. This is normal. The sticky feeling  will go away as the CHG dries. Do not use CHG: If you have a chlorhexidine allergy or have reacted to chlorhexidine in the past. On open wounds or areas of skin that have broken skin, cuts, or scrapes. On babies younger than 75 months of age. Contact a health care provider if: You have questions about using CHG. Your skin gets irritated or itchy. You have a rash after using CHG. You swallow any CHG. Call your local poison control center [phone] in the [place] Your eyes itch badly, or they become very red or swollen. Your hearing changes. You have trouble seeing. If you can't reach your provider, go to an urgent care or emergency room. Do not drive yourself. Get help right away if: You have swelling or tingling in your mouth or  throat. You make high-pitched whistling sounds when you breathe, most often when you breathe out (wheeze). You have trouble breathing. These symptoms may be an emergency. Call 911 right away. Do not wait to see if the symptoms will go away. Do not drive yourself to the hospital. This information is not intended to replace advice given to you by your health care provider. Make sure you discuss any questions you have with your health care provider. Document Revised: 08/29/2022 Document Reviewed: 08/25/2021 Elsevier Patient Education  2024 [employer].  Laparoscopic Surgery for Belly Hernias: What to Know After After the procedure, it's common to have pain, discomfort, or soreness. Follow these instructions at home: Medicines Take your medicines only as told. You may need to take steps to help treat or prevent trouble pooping (constipation), such as: Taking medicines to help you poop. Eating foods high in fiber, like beans, whole grains, and fresh fruits and vegetables. Drinking more fluids as told. Ask your health care provider if it's safe to drive or use machines while taking your medicine. Incision care  Take care of the cuts in your belly as told. Make sure you: Wash your hands with soap and water for at least 20 seconds before and after you change your bandage. If you can't use soap and water, use hand sanitizer. Change your bandage. Leave stitches or skin glue alone. Leave tape strips alone unless you're told to take them off. You may trim the edges of the tape strips if they curl up. Check the cuts on your belly every day for signs of infection. Check for: More redness, swelling, or pain. More fluid or blood. Warmth. Pus or a bad smell. Activity Rest as told. Get up to take short walks at least every 2 hours during the day. This helps you breathe better and keeps your blood flowing. Ask for help if you feel weak or unsteady. Do not take baths, swim, or use a hot tub until  you're told it's OK. Ask if you can shower. Ask if it's OK for you to lift. If you were given a sedative, do not drive or use machines until you're told it's safe. A sedative can make you sleepy. Ask what things are safe for you to do at home. Ask when you can go back to work or school. General instructions Hold a pillow over your belly when you cough or sneeze. This helps with pain. Wear a binder around your belly as told by your provider. Do not smoke, vape, or use nicotine or tobacco. Wear compression stockings to reduce swelling and help prevent blood clots in your legs. You may be asked to continue to do deep breathing exercises at home. This  will help to prevent a lung infection. Contact a health care provider if: You have any signs of infection. You have pain that gets worse or does not get better with medicine. You throw up or you feel like throwing up. You have a cough. You have not pooped in 3 days. You are not able to pee. You have a fever. Get help right away if: You have very bad pain in your belly. You throw up every time you eat or drink. You have redness, warmth, or pain in your leg. You have chest pain. You have trouble breathing. These symptoms may be an emergency. Call 911 right away. Do not wait to see if the symptoms will go away. Do not drive yourself to the hospital. This information is not intended to replace advice given to you by your health care provider. Make sure you discuss any questions you have with your health care provider. Document Revised: 08/22/2022 Document Reviewed: 08/22/2022 Elsevier Patient Education  2024 Else[employer] Inc.  General Anesthesia, Adult, Care After The following information offers guidance on how to care for yourself after your procedure. Your health care provider may also give you more specific instructions. If you have problems or questions, contact your health care provider. What can I expect after the procedure? After the  procedure, it is common for people to: Have pain or discomfort at the IV site. Have nausea or vomiting. Have a sore throat or hoarseness. Have trouble concentrating. Feel cold or chills. Feel weak, sleepy, or tired (fatigue). Have soreness and body aches. These can affect parts of the body that were not involved in surgery. Follow these instructions at home: For the time period you were told by your health care provider:  Rest. Do not participate in activities where you could fall or become injured. Do not drive or use machinery. Do not drink alcohol. Do not take sleeping pills or medicines that cause drowsiness. Do not make important decisions or sign legal documents. Do not take care of children on your own. General instructions Drink enough fluid to keep your urine pale yellow. If you have sleep apnea, surgery and certain medicines can increase your risk for breathing problems. Follow instructions from your health care provider about wearing your sleep device: Anytime you are sleeping, including during daytime naps. While taking prescription pain medicines, sleeping medicines, or medicines that make you drowsy. Return to your normal activities as told by your health care provider. Ask your health care provider what activities are safe for you. Take over-the-counter and prescription medicines only as told by your health care provider. Do not use any products that contain nicotine or tobacco. These products include cigarettes, chewing tobacco, and vaping devices, such as e-cigarettes. These can delay incision healing after surgery. If you need help quitting, ask your health care provider. Contact a health care provider if: You have nausea or vomiting that does not get better with medicine. You vomit every time you eat or drink. You have pain that does not get better with medicine. You cannot urinate or have bloody urine. You develop a skin rash. You have a fever. Get help right away  if: You have trouble breathing. You have chest pain. You vomit blood. These symptoms may be an emergency. Get help right away. Call 911. Do not wait to see if the symptoms will go away. Do not drive yourself to the hospital. Summary After the procedure, it is common to have a sore throat, hoarseness, nausea, vomiting, or to feel  weak, sleepy, or fatigue. For the time period you were told by your health care provider, do not drive or use machinery. Get help right away if you have difficulty breathing, have chest pain, or vomit blood. These symptoms may be an emergency. This information is not intended to replace advice given to you by your health care provider. Make sure you discuss any questions you have with your health care provider. Document Revised: 05/13/2021 Document Reviewed: 05/13/2021 Elsevier Patient Education  2024 [employer].

## 2024-01-08 ENCOUNTER — Other Ambulatory Visit: Payer: Self-pay

## 2024-01-08 ENCOUNTER — Encounter (HOSPITAL_COMMUNITY): Payer: Self-pay

## 2024-01-08 ENCOUNTER — Encounter (HOSPITAL_COMMUNITY)
Admission: RE | Admit: 2024-01-08 | Discharge: 2024-01-08 | Disposition: A | Discharge status: Home / Self Care | Source: Ambulatory Visit | Attending: General Surgery | Admitting: General Surgery

## 2024-01-08 VITALS — BP 118/79 | HR 76 | Resp 18 | Ht 74.0 in | Wt 321.0 lb

## 2024-01-08 DIAGNOSIS — I10 Essential (primary) hypertension: Secondary | ICD-10-CM | POA: Diagnosis not present

## 2024-01-08 DIAGNOSIS — Z01812 Encounter for preprocedural laboratory examination: Secondary | ICD-10-CM | POA: Diagnosis present

## 2024-01-08 DIAGNOSIS — Z01818 Encounter for other preprocedural examination: Secondary | ICD-10-CM | POA: Insufficient documentation

## 2024-01-08 DIAGNOSIS — Z01810 Encounter for preprocedural cardiovascular examination: Secondary | ICD-10-CM | POA: Diagnosis present

## 2024-01-08 LAB — BASIC METABOLIC PANEL WITH GFR
Anion gap: 13 (ref 5–15)
BUN: 16 mg/dL (ref 6–20)
CO2: 25 mmol/L (ref 22–32)
Calcium: 9 mg/dL (ref 8.9–10.3)
Chloride: 100 mmol/L (ref 98–111)
Creatinine, Ser: 1.35 mg/dL — ABNORMAL HIGH (ref 0.61–1.24)
GFR, Estimated: >60 mL/min (ref >60)
Glucose, Bld: 146 mg/dL — ABNORMAL HIGH (ref 70–99)
Potassium: 3.6 mmol/L (ref 3.5–5.1)
Sodium: 138 mmol/L (ref 135–145)

## 2024-01-08 LAB — CBC WITH DIFFERENTIAL/PLATELET
Abs Immature Granulocytes: 0.03 K/uL (ref 0.00–0.07)
Basophils Absolute: 0.1 K/uL (ref 0.0–0.1)
Basophils Relative: 1 %
Eosinophils Absolute: 0.3 K/uL (ref 0.0–0.5)
Eosinophils Relative: 4 %
HCT: 44.4 % (ref 39.0–52.0)
Hemoglobin: 14.9 g/dL (ref 13.0–17.0)
Immature Granulocytes: 0 %
Lymphocytes Relative: 23 %
Lymphs Abs: 1.6 K/uL (ref 0.7–4.0)
MCH: 31 pg (ref 26.0–34.0)
MCHC: 33.6 g/dL (ref 30.0–36.0)
MCV: 92.5 fL (ref 80.0–100.0)
Monocytes Absolute: 0.7 K/uL (ref 0.1–1.0)
Monocytes Relative: 10 %
Neutro Abs: 4.3 K/uL (ref 1.7–7.7)
Neutrophils Relative %: 62 %
Platelets: 331 K/uL (ref 150–400)
RBC: 4.8 MIL/uL (ref 4.22–5.81)
RDW: 13.3 % (ref 11.5–15.5)
WBC: 6.9 K/uL (ref 4.0–10.5)
nRBC: 0 % (ref 0.0–0.2)

## 2024-01-10 ENCOUNTER — Ambulatory Visit (HOSPITAL_COMMUNITY)
Admission: RE | Admit: 2024-01-10 | Discharge: 2024-01-10 | Disposition: A | Discharge status: Home / Self Care | Attending: General Surgery | Admitting: General Surgery

## 2024-01-10 ENCOUNTER — Ambulatory Visit (HOSPITAL_COMMUNITY): Admitting: Certified Registered"

## 2024-01-10 ENCOUNTER — Encounter (HOSPITAL_COMMUNITY): Payer: Self-pay | Admitting: General Surgery

## 2024-01-10 ENCOUNTER — Encounter (HOSPITAL_COMMUNITY)
Admission: RE | Disposition: A | Discharge status: Home / Self Care | Payer: Self-pay | Source: Home / Self Care | Attending: General Surgery

## 2024-01-10 DIAGNOSIS — E119 Type 2 diabetes mellitus without complications: Secondary | ICD-10-CM

## 2024-01-10 DIAGNOSIS — I10 Essential (primary) hypertension: Secondary | ICD-10-CM

## 2024-01-10 DIAGNOSIS — Z7985 Long-term (current) use of injectable non-insulin antidiabetic drugs: Secondary | ICD-10-CM | POA: Insufficient documentation

## 2024-01-10 DIAGNOSIS — K429 Umbilical hernia without obstruction or gangrene: Secondary | ICD-10-CM | POA: Insufficient documentation

## 2024-01-10 DIAGNOSIS — Z6841 Body Mass Index (BMI) 40.0 and over, adult: Secondary | ICD-10-CM | POA: Diagnosis not present

## 2024-01-10 DIAGNOSIS — Z794 Long term (current) use of insulin: Secondary | ICD-10-CM | POA: Diagnosis not present

## 2024-01-10 DIAGNOSIS — E6689 Other obesity not elsewhere classified: Secondary | ICD-10-CM | POA: Insufficient documentation

## 2024-01-10 LAB — GLUCOSE, CAPILLARY
Glucose-Capillary: 109 mg/dL — ABNORMAL HIGH (ref 70–99)
Glucose-Capillary: 148 mg/dL — ABNORMAL HIGH (ref 70–99)

## 2024-01-10 SURGERY — REPAIR, HERNIA, UMBILICAL, ROBOT-ASSISTED
Anesthesia: General | Site: Abdomen

## 2024-01-10 MED ORDER — PROPOFOL 10 MG/ML IV BOLUS
INTRAVENOUS | Status: AC
  Filled 2024-01-10: qty 20

## 2024-01-10 MED ORDER — FENTANYL CITRATE (PF) 250 MCG/5ML IJ SOLN
INTRAMUSCULAR | Status: AC
  Filled 2024-01-10: qty 5

## 2024-01-10 MED ORDER — CHLORHEXIDINE GLUCONATE CLOTH 2 % EX PADS
6.0000 | MEDICATED_PAD | Freq: Once | CUTANEOUS | Status: AC
  Administered 2024-01-10: 6 via TOPICAL

## 2024-01-10 MED ORDER — MIDAZOLAM HCL (PF) 2 MG/2ML IJ SOLN
INTRAMUSCULAR | Status: DC | PRN
  Administered 2024-01-10: 2 mg via INTRAVENOUS

## 2024-01-10 MED ORDER — PROPOFOL 10 MG/ML IV BOLUS
INTRAVENOUS | Status: DC | PRN
  Administered 2024-01-10: 200 mg via INTRAVENOUS

## 2024-01-10 MED ORDER — ROCURONIUM BROMIDE 10 MG/ML (PF) SYRINGE
PREFILLED_SYRINGE | INTRAVENOUS | Status: DC | PRN
  Administered 2024-01-10: 80 mg via INTRAVENOUS

## 2024-01-10 MED ORDER — ONDANSETRON HCL 4 MG/2ML IJ SOLN
INTRAMUSCULAR | Status: DC | PRN
  Administered 2024-01-10: 4 mg via INTRAVENOUS

## 2024-01-10 MED ORDER — KETOROLAC TROMETHAMINE 30 MG/ML IJ SOLN
30.0000 mg | Freq: Once | INTRAMUSCULAR | Status: AC
  Administered 2024-01-10: 30 mg via INTRAVENOUS
  Filled 2024-01-10: qty 1

## 2024-01-10 MED ORDER — LIDOCAINE 2% (20 MG/ML) 5 ML SYRINGE
INTRAMUSCULAR | Status: AC
  Filled 2024-01-10: qty 5

## 2024-01-10 MED ORDER — OXYCODONE HCL 5 MG PO TABS
5.0000 mg | ORAL_TABLET | Freq: Once | ORAL | Status: AC | PRN
  Administered 2024-01-10: 5 mg via ORAL
  Filled 2024-01-10: qty 1

## 2024-01-10 MED ORDER — SUGAMMADEX SODIUM 200 MG/2ML IV SOLN
INTRAVENOUS | Status: DC | PRN
  Administered 2024-01-10: 200 mg via INTRAVENOUS

## 2024-01-10 MED ORDER — BUPIVACAINE HCL (PF) 0.5 % IJ SOLN
INTRAMUSCULAR | Status: AC
  Filled 2024-01-10: qty 30

## 2024-01-10 MED ORDER — ROCURONIUM BROMIDE 10 MG/ML (PF) SYRINGE
PREFILLED_SYRINGE | INTRAVENOUS | Status: AC
  Filled 2024-01-10: qty 10

## 2024-01-10 MED ORDER — DEXMEDETOMIDINE HCL IN NACL 80 MCG/20ML IV SOLN
INTRAVENOUS | Status: AC
  Filled 2024-01-10: qty 20

## 2024-01-10 MED ORDER — CHLORHEXIDINE GLUCONATE 0.12 % MT SOLN
15.0000 mL | Freq: Once | OROMUCOSAL | Status: AC
Start: 2024-01-10 — End: 2024-01-10
  Administered 2024-01-10: 15 mL via OROMUCOSAL

## 2024-01-10 MED ORDER — LIDOCAINE HCL (CARDIAC) PF 100 MG/5ML IV SOSY
PREFILLED_SYRINGE | INTRAVENOUS | Status: DC | PRN
  Administered 2024-01-10: 100 mg via INTRATRACHEAL

## 2024-01-10 MED ORDER — FENTANYL CITRATE (PF) 50 MCG/ML IJ SOSY
25.0000 ug | PREFILLED_SYRINGE | INTRAMUSCULAR | Status: DC | PRN
  Administered 2024-01-10: 50 ug via INTRAVENOUS
  Filled 2024-01-10: qty 1

## 2024-01-10 MED ORDER — ORAL CARE MOUTH RINSE: 15.0000 mL | Freq: Once | OROMUCOSAL | Status: AC

## 2024-01-10 MED ORDER — STERILE WATER FOR IRRIGATION IR SOLN
Status: DC | PRN
  Administered 2024-01-10: 500 mL

## 2024-01-10 MED ORDER — ONDANSETRON HCL 4 MG/2ML IJ SOLN: 4.0000 mg | Freq: Once | INTRAMUSCULAR | Status: DC | PRN

## 2024-01-10 MED ORDER — ONDANSETRON HCL 4 MG/2ML IJ SOLN
INTRAMUSCULAR | Status: AC
Start: 2024-01-10 — End: 2024-01-10
  Filled 2024-01-10: qty 2

## 2024-01-10 MED ORDER — SUCCINYLCHOLINE CHLORIDE 200 MG/10ML IV SOSY
PREFILLED_SYRINGE | INTRAVENOUS | Status: DC | PRN
  Administered 2024-01-10: 200 mg via INTRAVENOUS

## 2024-01-10 MED ORDER — DEXAMETHASONE SOD PHOSPHATE PF 10 MG/ML IJ SOLN
INTRAMUSCULAR | Status: DC | PRN
  Administered 2024-01-10: 4 mg via INTRAVENOUS

## 2024-01-10 MED ORDER — BUPIVACAINE HCL (PF) 0.5 % IJ SOLN
INTRAMUSCULAR | Status: DC | PRN
  Administered 2024-01-10: 30 mL

## 2024-01-10 MED ORDER — DEXMEDETOMIDINE HCL IN NACL 80 MCG/20ML IV SOLN
INTRAVENOUS | Status: DC | PRN
  Administered 2024-01-10: 20 ug via INTRAVENOUS

## 2024-01-10 MED ORDER — OXYCODONE HCL 5 MG PO TABS: 5.0000 mg | ORAL_TABLET | ORAL | 0 refills | Status: AC | PRN

## 2024-01-10 MED ORDER — SUCCINYLCHOLINE CHLORIDE 200 MG/10ML IV SOSY
PREFILLED_SYRINGE | INTRAVENOUS | Status: AC
  Filled 2024-01-10: qty 10

## 2024-01-10 MED ORDER — LACTATED RINGERS IV SOLN: INTRAVENOUS | Status: DC

## 2024-01-10 MED ORDER — MIDAZOLAM HCL 2 MG/2ML IJ SOLN
INTRAMUSCULAR | Status: AC
  Filled 2024-01-10: qty 2

## 2024-01-10 MED ORDER — OXYCODONE HCL 5 MG/5ML PO SOLN: 5.0000 mg | Freq: Once | ORAL | Status: AC | PRN

## 2024-01-10 MED ORDER — FENTANYL CITRATE (PF) 100 MCG/2ML IJ SOLN
INTRAMUSCULAR | Status: DC | PRN
  Administered 2024-01-10 (×2): 50 ug via INTRAVENOUS
  Administered 2024-01-10: 100 ug via INTRAVENOUS
  Administered 2024-01-10: 50 ug via INTRAVENOUS

## 2024-01-10 MED ORDER — CEFAZOLIN SODIUM-DEXTROSE 3-4 GM/150ML-% IV SOLN
3.0000 g | INTRAVENOUS | Status: AC
  Administered 2024-01-10: 3 g via INTRAVENOUS
  Filled 2024-01-10: qty 150

## 2024-01-10 MED ORDER — KETOROLAC TROMETHAMINE 30 MG/ML IJ SOLN
INTRAMUSCULAR | Status: AC
  Filled 2024-01-10: qty 1

## 2024-01-10 SURGICAL SUPPLY — 38 items
CHLORAPREP W/TINT 26 (MISCELLANEOUS) ×1 IMPLANT
COVER LIGHT HANDLE (MISCELLANEOUS) IMPLANT
COVER MAYO STAND XLG (MISCELLANEOUS) ×1 IMPLANT
COVER TIP SHEARS 8 DVNC (MISCELLANEOUS) ×1 IMPLANT
DERMABOND ADVANCED .7 DNX12 (GAUZE/BANDAGES/DRESSINGS) ×1 IMPLANT
DRAPE ARM DVNC X/XI (DISPOSABLE) ×3 IMPLANT
DRAPE COLUMN DVNC XI (DISPOSABLE) ×1 IMPLANT
DRIVER NDL MEGA SUTCUT DVNCXI (INSTRUMENTS) ×1 IMPLANT
DRIVER NDLE MEGA SUTCUT DVNCXI (INSTRUMENTS) ×1 IMPLANT
DRSG TEGADERM 2-3/8X2-3/4 SM (GAUZE/BANDAGES/DRESSINGS) IMPLANT
ELECTRODE REM PT RTRN 9FT ADLT (ELECTROSURGICAL) ×1 IMPLANT
FORCEPS BPLR R/ABLATION 8 DVNC (INSTRUMENTS) ×1 IMPLANT
GAUZE SPONGE 2X2 STRL 8-PLY (GAUZE/BANDAGES/DRESSINGS) IMPLANT
GLOVE BIO SURGEON STRL SZ7 (GLOVE) IMPLANT
GLOVE BIOGEL PI IND STRL 7.0 (GLOVE) ×3 IMPLANT
GLOVE SURG SS PI 7.5 STRL IVOR (GLOVE) ×2 IMPLANT
GOWN STRL REUS W/TWL LRG LVL3 (GOWN DISPOSABLE) ×3 IMPLANT
KIT TURNOVER KIT A (KITS) ×1 IMPLANT
MANIFOLD NEPTUNE II (INSTRUMENTS) ×1 IMPLANT
MESH VENTRALIGHT ST 4.5IN (Mesh General) IMPLANT
NDL HYPO 21X1.5 SAFETY (NEEDLE) ×1 IMPLANT
NDL INSUFFLATION 14GA 120MM (NEEDLE) ×1 IMPLANT
NEEDLE HYPO 21X1.5 SAFETY (NEEDLE) ×1 IMPLANT
NEEDLE INSUFFLATION 14GA 120MM (NEEDLE) ×1 IMPLANT
OBTURATOR OPTICALSTD 8 DVNC (TROCAR) ×1 IMPLANT
PACK LAP CHOLE LZT030E (CUSTOM PROCEDURE TRAY) ×1 IMPLANT
PENCIL HANDSWITCHING (ELECTRODE) ×1 IMPLANT
SCISSORS MNPLR CVD DVNC XI (INSTRUMENTS) ×1 IMPLANT
SEAL UNIV 5-12 XI (MISCELLANEOUS) ×3 IMPLANT
SET BASIN LINEN APH (SET/KITS/TRAYS/PACK) ×1 IMPLANT
SET TUBE SMOKE EVAC HIGH FLOW (TUBING) ×1 IMPLANT
SUT MNCRL AB 4-0 PS2 18 (SUTURE) ×2 IMPLANT
SUT STRATA 3-0 SH (SUTURE) ×2 IMPLANT
SUTURE STRATFX 0 PDS+ CT-2 23 (SUTURE) ×1 IMPLANT
SYR 30ML LL (SYRINGE) ×1 IMPLANT
SYSTEM RETRIEVL 5MM INZII UNIV (BASKET) IMPLANT
TAPE TRANSPORE STRL 2 31045 (GAUZE/BANDAGES/DRESSINGS) ×1 IMPLANT
WATER STERILE IRR 500ML POUR (IV SOLUTION) ×1 IMPLANT

## 2024-01-10 NOTE — Interval H&P Note (Signed)
 History and Physical Interval Note:  01/10/2024 9:39 AM  Patrick Chandler  has presented today for surgery, with the diagnosis of HERNIA, UMBILICAL 3- 10 CM.  The various methods of treatment have been discussed with the patient and family. After consideration of risks, benefits and other options for treatment, the patient has consented to  Procedure(s) with comments: REPAIR, HERNIA, UMBILICAL, ROBOT-ASSISTED (N/A) - W/ MESH as a surgical intervention.  The patient's history has been reviewed, patient examined, no change in status, stable for surgery.  I have reviewed the patient's chart and labs.  Questions were answered to the patient's satisfaction.     [name]

## 2024-01-10 NOTE — Anesthesia Procedure Notes (Signed)
 Procedure Name: Intubation Date/Time: 01/10/2024 10:15 AM  Performed by: Pheobe Adine CROME, CRNAPre-anesthesia Checklist: Patient identified, Emergency Drugs available, Suction available, Patient being monitored and Timeout performed Patient Re-evaluated:Patient Re-evaluated prior to induction Oxygen Delivery Method: Circle system utilized Preoxygenation: Pre-oxygenation with 100% oxygen Induction Type: IV induction Laryngoscope Size: Mac and 3 Grade View: Grade I Tube type: Oral Tube size: 8.0 mm Number of attempts: 1 Airway Equipment and Method: Stylet and Video-laryngoscopy Placement Confirmation: ETT inserted through vocal cords under direct vision, positive ETCO2, CO2 detector and breath sounds checked- equal and bilateral Secured at: 23 cm Tube secured with: Tape Dental Injury: Teeth and Oropharynx as per pre-operative assessment  Comments: By resident

## 2024-01-10 NOTE — Anesthesia Preprocedure Evaluation (Signed)
 Anesthesia Evaluation  Patient identified by MRN, date of birth, ID band Patient awake    Reviewed: Allergy & Precautions, H&P , NPO status , Patient's Chart, lab work & pertinent test results, reviewed documented beta blocker date and time   Airway Mallampati: II  TM Distance: >3 FB Neck ROM: full    Dental no notable dental hx.    Pulmonary neg pulmonary ROS   Pulmonary exam normal breath sounds clear to auscultation       Cardiovascular Exercise Tolerance: Good hypertension, negative cardio ROS  Rhythm:regular Rate:Normal     Neuro/Psych negative neurological ROS  negative psych ROS   GI/Hepatic negative GI ROS, Neg liver ROS,,,  Endo/Other  diabetes  Class 4 obesity  Renal/GU negative Renal ROS  negative genitourinary   Musculoskeletal   Abdominal   Peds  Hematology negative hematology ROS (+)   Anesthesia Other Findings   Reproductive/Obstetrics negative OB ROS                             Anesthesia Physical Anesthesia Plan  ASA: 3  Anesthesia Plan: General and General ETT   Post-op Pain Management:    Induction:   PONV Risk Score and Plan: Ondansetron  Airway Management Planned:   Additional Equipment:   Intra-op Plan:   Post-operative Plan:   Informed Consent: I have reviewed the patients History and Physical, chart, labs and discussed the procedure including the risks, benefits and alternatives for the proposed anesthesia with the patient or authorized representative who has indicated his/her understanding and acceptance.     Dental Advisory Given  Plan Discussed with: CRNA  Anesthesia Plan Comments:        Anesthesia Quick Evaluation

## 2024-01-10 NOTE — Transfer of Care (Signed)
 Immediate Anesthesia Transfer of Care Note  Patient: Patrick Chandler  Procedure(s) Performed: REPAIR, HERNIA, UMBILICAL, ROBOT-ASSISTED WITH MESH (Abdomen)  Patient Location: PACU  Anesthesia Type:General  Level of Consciousness: drowsy, patient cooperative, and responds to stimulation  Airway & Oxygen Therapy: Patient Spontanous Breathing and Patient connected to face mask oxygen  Post-op Assessment: Report given to RN and Post -op Vital signs reviewed and stable  Post vital signs: Reviewed and stable  Last Vitals:  Vitals Value Taken Time  BP    Temp 98.2   Pulse 82 01/10/24 11:33  Resp 21 01/10/24 11:33  SpO2 93 % 01/10/24 11:33  Vitals shown include unfiled device data.  Last Pain:  Vitals:   01/10/24 0935  PainSc: 0-No pain         Complications: No notable events documented.

## 2024-01-10 NOTE — Op Note (Signed)
 Patient:  Patrick Chandler  DOB:  04-23-1980  MRN:  [id number]   Preop Diagnosis: Umbilical hernia  Postop Diagnosis: Same  Procedure: Robotic assisted laparoscopic umbilical herniorrhaphy with mesh  Surgeon: [name], MD  Anes: General Endotracheal  Indications: Patient is a 43 year old white male who presents with a symptomatic umbilical hernia.  The risks and benefits of the procedure including bleeding, infection, mesh use, bowel injury, and the possibility of recurrence of the hernia were fully explained to the patient, who gave informed consent.  Procedure note: The patient was placed in the supine position.  After induction of general endotracheal anesthesia, the abdomen was prepped and draped using the usual sterile technique with ChloraPrep.  Surgical site confirmation was performed.  An incision was made in the left upper quadrant at Palmer's point.  A Veress needle was introduced into the abdominal cavity and confirmation of placement was done using the saline drop test.  The abdomen was then insufflated to 15 mmHg pressure.  An 8 mm trocar was introduced into the abdominal cavity under direct visualization without difficulty.  Additional 8 mm trocars were placed in the left flank and left lower quadrant regions.  The robot was then docked and targeted.  The patient had omentum that was adhered to the umbilical hernia sac.  This was freed away using Bovie electrocautery.  I was able to remove some of the hernia sac along with surrounding adipose tissue along the abdominal wall.  The resultant defect was approximately 3.5 cm in its greatest diameter.  The hernia defect was closed using an 0  Stratafix running suture.  An 11 cm Bard Ventralight DualMesh was then inserted and secured to the anterior abdominal wall using 3-0 STRATAFIX running sutures.  The bowel was inspected and no injury was noted.  The robot was undocked and all air was evacuated from the abdominal cavity prior to  removal of the trocars.  All wounds were irrigated with normal saline.  All wounds were injected with 0.5% Sensorcaine.  All incisions were closed using a 4-0 Monocryl subcuticular suture.  Dermabond was applied.  All tape and needle counts were correct at the end of the procedure.  The patient was extubated in the operating room and transferred to PACU in stable condition.  Complications: None  EBL: Minimal  Specimen: None

## 2024-01-11 NOTE — Anesthesia Postprocedure Evaluation (Signed)
 Anesthesia Post Note  Patient: Timothee Goecke  Procedure(s) Performed: REPAIR, HERNIA, UMBILICAL, ROBOT-ASSISTED WITH MESH (Abdomen)  Patient location during evaluation: Phase II Anesthesia Type: General Level of consciousness: awake Pain management: pain level controlled Vital Signs Assessment: post-procedure vital signs reviewed and stable Respiratory status: spontaneous breathing and respiratory function stable Cardiovascular status: blood pressure returned to baseline and stable Postop Assessment: no headache and no apparent nausea or vomiting Anesthetic complications: no Comments: Late entry   No notable events documented.   Last Vitals:  Vitals:   01/10/24 1215 01/10/24 1233  BP: 114/75 116/82  Pulse: 92 88  Resp: 18 18  Temp: (!) 36.3 C (!) 36.4 C  SpO2: 90% 92%    Last Pain:  Vitals:   01/10/24 1233  TempSrc: Oral  PainSc: 3                  Yvonna JINNY Bosworth

## 2024-01-15 ENCOUNTER — Ambulatory Visit (INDEPENDENT_AMBULATORY_CARE_PROVIDER_SITE_OTHER): Admitting: General Surgery

## 2024-01-15 ENCOUNTER — Encounter: Payer: Self-pay | Admitting: General Surgery

## 2024-01-15 VITALS — BP 125/86 | HR 71 | Temp 98.1°F | Resp 14 | Ht 74.0 in | Wt 317.0 lb

## 2024-01-15 DIAGNOSIS — Z09 Encounter for follow-up examination after completed treatment for conditions other than malignant neoplasm: Secondary | ICD-10-CM | POA: Diagnosis not present

## 2024-01-15 NOTE — Progress Notes (Signed)
 Subjective:     Patrick Chandler  Here for postoperative visit, status post robotic assisted laparoscopic umbilical herniorrhaphy with mesh.  He is doing well.  He has no incisional pain.  Some soreness is noted around the umbilicus. Objective:    BP 125/86   Pulse 71   Temp 98.1 F (36.7 C) (Oral)   Resp 14   Ht 6' 2 (1.88 m)   Wt (!) 317 lb (143.8 kg)   SpO2 96%   BMI 40.70 kg/m   General:  alert, cooperative, and no distress  Abdomen soft, incisions healing well.  Umbilicus healing well.     Assessment:    Doing well postoperatively.    Plan:   May gradually increase the amount he is lifting.  He does have a disabled daughter at home and he is adjusting how he lifts her.  I told him to keep his back straight and use his thighs when lifting.  He states he will only gradually increase the amount of weight he is lifting.  I told him to return to my care should any questions arise.  Follow-up here as needed.

## 2024-02-14 ENCOUNTER — Encounter: Admitting: Gastroenterology

## 2024-02-15 ENCOUNTER — Encounter: Payer: Self-pay | Admitting: Gastroenterology

## 2024-03-02 ENCOUNTER — Telehealth: Payer: Self-pay | Admitting: Gastroenterology

## 2024-03-02 NOTE — Telephone Encounter (Signed)
 Reviewed labs recently sent on patient (11/2023 labs). CBC, LFTs normal.  Please remind patient to have the amylase isoenzyme labs done that was ordered in 11/2023.

## 2024-03-03 NOTE — Telephone Encounter (Signed)
 Pt was made aware and verbalized understanding.
